# Patient Record
Sex: Male | Born: 1965
Health system: Southern US, Community
[De-identification: ages and names within clinical notes are randomized; demographics above are authoritative.]

## PROBLEM LIST (undated history)

## (undated) DIAGNOSIS — K219 Gastro-esophageal reflux disease without esophagitis: Secondary | ICD-10-CM

## (undated) DIAGNOSIS — E785 Hyperlipidemia, unspecified: Secondary | ICD-10-CM

## (undated) DIAGNOSIS — E119 Type 2 diabetes mellitus without complications: Secondary | ICD-10-CM

## (undated) DIAGNOSIS — I10 Essential (primary) hypertension: Secondary | ICD-10-CM

## (undated) DIAGNOSIS — I2510 Atherosclerotic heart disease of native coronary artery without angina pectoris: Secondary | ICD-10-CM

## (undated) DIAGNOSIS — I739 Peripheral vascular disease, unspecified: Secondary | ICD-10-CM

## (undated) HISTORY — DX: Type 2 diabetes mellitus without complications: E11.9

## (undated) HISTORY — DX: Essential (primary) hypertension: I10

## (undated) HISTORY — DX: Hyperlipidemia, unspecified: E78.5

---

## 2002-07-21 DIAGNOSIS — E119 Type 2 diabetes mellitus without complications: Secondary | ICD-10-CM

## 2002-07-21 HISTORY — DX: Type 2 diabetes mellitus without complications: E11.9

## 2005-09-28 ENCOUNTER — Emergency Department (HOSPITAL_COMMUNITY): Admission: EM | Admit: 2005-09-28 | Discharge: 2005-09-28 | Payer: Self-pay | Admitting: Emergency Medicine

## 2007-09-23 ENCOUNTER — Encounter: Payer: Self-pay | Admitting: Endocrinology

## 2007-09-28 ENCOUNTER — Encounter: Payer: Self-pay | Admitting: Endocrinology

## 2007-10-11 ENCOUNTER — Ambulatory Visit: Payer: Self-pay | Admitting: Endocrinology

## 2007-10-11 DIAGNOSIS — R209 Unspecified disturbances of skin sensation: Secondary | ICD-10-CM | POA: Insufficient documentation

## 2007-10-11 DIAGNOSIS — I10 Essential (primary) hypertension: Secondary | ICD-10-CM | POA: Insufficient documentation

## 2007-10-11 DIAGNOSIS — E7800 Pure hypercholesterolemia, unspecified: Secondary | ICD-10-CM | POA: Insufficient documentation

## 2007-10-11 HISTORY — DX: Essential (primary) hypertension: I10

## 2007-10-21 ENCOUNTER — Ambulatory Visit: Payer: Self-pay | Admitting: Endocrinology

## 2007-11-12 ENCOUNTER — Ambulatory Visit: Payer: Self-pay | Admitting: Endocrinology

## 2007-11-12 DIAGNOSIS — E109 Type 1 diabetes mellitus without complications: Secondary | ICD-10-CM | POA: Insufficient documentation

## 2007-12-29 ENCOUNTER — Ambulatory Visit: Payer: Self-pay | Admitting: Internal Medicine

## 2008-01-07 ENCOUNTER — Telehealth: Payer: Self-pay | Admitting: Endocrinology

## 2008-04-06 ENCOUNTER — Ambulatory Visit: Payer: Self-pay | Admitting: Endocrinology

## 2008-06-08 ENCOUNTER — Encounter (INDEPENDENT_AMBULATORY_CARE_PROVIDER_SITE_OTHER): Payer: Self-pay | Admitting: *Deleted

## 2008-06-16 ENCOUNTER — Telehealth (INDEPENDENT_AMBULATORY_CARE_PROVIDER_SITE_OTHER): Payer: Self-pay | Admitting: *Deleted

## 2008-09-29 ENCOUNTER — Ambulatory Visit: Payer: Self-pay | Admitting: Endocrinology

## 2008-11-20 ENCOUNTER — Telehealth: Payer: Self-pay | Admitting: Internal Medicine

## 2008-11-21 ENCOUNTER — Telehealth (INDEPENDENT_AMBULATORY_CARE_PROVIDER_SITE_OTHER): Payer: Self-pay | Admitting: *Deleted

## 2008-11-23 ENCOUNTER — Ambulatory Visit: Payer: Self-pay | Admitting: Endocrinology

## 2009-02-05 ENCOUNTER — Telehealth (INDEPENDENT_AMBULATORY_CARE_PROVIDER_SITE_OTHER): Payer: Self-pay | Admitting: *Deleted

## 2009-03-07 ENCOUNTER — Telehealth: Payer: Self-pay | Admitting: Endocrinology

## 2009-04-05 ENCOUNTER — Ambulatory Visit: Payer: Self-pay | Admitting: Endocrinology

## 2009-05-16 ENCOUNTER — Telehealth (INDEPENDENT_AMBULATORY_CARE_PROVIDER_SITE_OTHER): Payer: Self-pay | Admitting: *Deleted

## 2009-05-22 ENCOUNTER — Ambulatory Visit: Payer: Self-pay | Admitting: Endocrinology

## 2009-05-22 LAB — CONVERTED CEMR LAB
Hgb A1c MFr Bld: 8.1 % — ABNORMAL HIGH (ref 4.6–6.5)
Microalb Creat Ratio: 5.4 mg/g (ref 0.0–30.0)

## 2009-08-07 ENCOUNTER — Encounter: Payer: Self-pay | Admitting: Endocrinology

## 2009-08-13 ENCOUNTER — Ambulatory Visit: Payer: Self-pay | Admitting: Endocrinology

## 2009-08-13 DIAGNOSIS — R05 Cough: Secondary | ICD-10-CM

## 2009-08-13 DIAGNOSIS — R059 Cough, unspecified: Secondary | ICD-10-CM | POA: Insufficient documentation

## 2009-08-13 LAB — CONVERTED CEMR LAB: Hgb A1c MFr Bld: 8 % — ABNORMAL HIGH (ref 4.6–6.5)

## 2010-08-05 ENCOUNTER — Telehealth: Payer: Self-pay | Admitting: Endocrinology

## 2010-08-21 NOTE — Letter (Signed)
Summary: [facility]  [facility]   Imported By: Lester Cloverly 08/20/2009 10:57:21  _____________________________________________________________________  External Attachment:    Type:   Image     Comment:   External Document

## 2010-08-21 NOTE — Assessment & Plan Note (Signed)
Summary: 3 MTH FU  #  STC  PRESSED "1" TO CX/ LMOM TO CONFIRM/NWS   Vital Signs:  Patient profile:   45 year old male Height:      68 inches (172.72 cm) Weight:      163.25 pounds (74.20 kg) O2 Sat:      97 % on Room air Temp:     97.4 degrees F (36.33 degrees C) oral Pulse rate:   86 / minute BP sitting:   130 / 80  (left arm) Cuff size:   large  Vitals Entered By: Josph Macho CMA (August 13, 2009 7:56 AM)  O2 Flow:  Room air CC: 3 month follow up/ CF Is Patient Diabetic? Yes   Referring Provider:  swayne  CC:  3 month follow up/ CF.  History of Present Illness: pt states slight intermittent chronic dry-quality cough.  no associated pain in the chest. no cbg record, but states cbg's are well-controlled, except up to mid-100's in the afternoon.     Current Medications (verified): 1)  Lisinopril 10 Mg  Tabs (Lisinopril) .... Take 1 Tablet By Mouth Once A Day 2)  Lantus Solostar 100 Unit/ml  Soln (Insulin Glargine) .... 3 Units Qhs 3)  Crestor 10 Mg Tabs (Rosuvastatin Calcium) .... Take 1 By Mouth Qd 4)  Novolog Flexpen 100 Unit/ml Soln (Insulin Aspart) .... 3 Units Three Times A Day (Qac) 5)  Bd U/f Short Pen Needle 31g X 8 Mm Misc (Insulin Pen Needle) .... Qid 6)  Relion Pen Needle 31g X 8 Mm Misc (Insulin Pen Needle) .... Use Qid 7)  Humalog Kwikpen 100 Unit/ml Soln (Insulin Lispro (Human)) .... 3 Units Three Times A Day (Qac)  Allergies (verified): No Known Drug Allergies  Past History:  Past Medical History: Last updated: 04/06/2008 HYPERCHOLESTEROLEMIA (ICD-272.0) HYPERTENSION (ICD-401.9) NUMBNESS (ICD-782.0) DIABETES MELLITUS, TYPE I, UNCONTROLLED (ICD-250.03)  Review of Systems  The patient denies dyspnea on exertion.         denies hypoglycemia  Physical Exam  General:  normal appearance.   Lungs:  Clear to auscultation bilaterally. Normal respiratory effort.  Additional Exam:  Hemoglobin A1C       [H]  8.0 %      Impression &  Recommendations:  Problem # 1:  DIABETES MELLITUS, TYPE I (ICD-250.01) needs increased rx  Problem # 2:  COUGH DUE TO ACE INHIBITORS (ICD-786.2) Assessment: New  Medications Added to Medication List This Visit: 1)  Humalog Kwikpen 100 Unit/ml Soln (Insulin lispro (human)) .... Three times a day (just before each meal) 3-5-3 units 2)  Losartan Potassium 50 Mg Tabs (Losartan potassium) .Marland Kitchen.. 1 once daily  Other Orders: TLB-A1C / Hgb A1C (Glycohemoglobin) (83036-A1C) Est. Patient Level IV ([address])  Patient Instructions: 1)  continue humalog (or novolog) 3 units three times a day (just before each meal) 2)  continue lantus 5 units at night 3)  check your blood sugar 2 times a day.  vary the time of day when you check, between before the 3 meals, and at bedtime.  also check if you have symptoms of your blood sugar being too high or too low.  please keep a record of the readings and bring it to your next appointment here.  please call us sooner if you are having low blood sugar episodes. 4)  tests are being ordered for you today.  a few days after the test(s), please call [phone] to hear your test results.   5)  Please schedule a follow-up appointment  in 3 months. 6)  change lisinopril to losartan 50 mg once daily. 7)  (update: i left message on phone-tree:  increase lunch humalog to 5 units) Prescriptions: LOSARTAN POTASSIUM 50 MG TABS (LOSARTAN POTASSIUM) 1 once daily  #30 x 11   Entered and Authorized by:   Minus Breeding MD   Signed by:   Minus Breeding MD on 08/13/2009   Method used:   Electronically to        [facility] (retail)       [address]       [place], [place]  [address]       Ph: [phone]       Fax: [phone]   RxID:   [id number]

## 2010-08-22 NOTE — Progress Notes (Signed)
Summary: lantus & humalog  Phone Note Refill Request Message from:  Fax from Pharmacy on August 05, 2010 1:46 PM  Refills Requested: Medication #1:  LANTUS SOLOSTAR 100 UNIT/ML  SOLN 3 units qhs  Medication #2:  HUMALOG KWIKPEN 100 UNIT/ML SOLN three times a day (just before each meal) 3-5-3 units [facility] [address] Needing new rx sent pt has change pharacy never filled here. Pls send new rx   Method Requested: Electronic Initial call taken by: Orlan Leavens RMA,  August 05, 2010 1:47 PM Caller: [facility] [address]    Prescriptions: HUMALOG KWIKPEN 100 UNIT/ML SOLN (INSULIN LISPRO (HUMAN)) three times a day (just before each meal) 3-5-3 units  #15 [place] x 0   Entered by:   Orlan Leavens RMA   Authorized by:   Minus Breeding MD   Signed by:   Orlan Leavens RMA on 08/05/2010   Method used:   Electronically to        [id number] (retail)       [address]       [place], [place]  [address]       Ph: [phone]       Fax: [phone]   RxID:   [id number] LANTUS SOLOSTAR 100 UNIT/ML  SOLN (INSULIN GLARGINE) 3 units qhs  #15 [place] x 0   Entered by:   Orlan Leavens RMA   Authorized by:   Minus Breeding MD   Signed by:   Orlan Leavens RMA on 08/05/2010   Method used:   Electronically to        [id number] (retail)       [address]       [place], [place]  [address]       Ph: [phone]       Fax: [phone]   RxID:   [id number]

## 2012-06-06 ENCOUNTER — Ambulatory Visit (INDEPENDENT_AMBULATORY_CARE_PROVIDER_SITE_OTHER): Payer: 59 | Admitting: Internal Medicine

## 2012-06-06 VITALS — BP 130/83 | HR 89 | Temp 98.5°F | Resp 16 | Ht 67.75 in | Wt 192.6 lb

## 2012-06-06 DIAGNOSIS — J019 Acute sinusitis, unspecified: Secondary | ICD-10-CM

## 2012-06-06 MED ORDER — AMOXICILLIN 500 MG PO CAPS: 1000.0000 mg | ORAL_CAPSULE | Freq: Two times a day (BID) | ORAL | Status: AC

## 2012-06-06 NOTE — Progress Notes (Signed)
  Subjective:    Patient ID: Peter Blake, male    DOB: [birth year], 46 y.o.   MRN: [id number]  HPI C/o 3 weeks of congestion getting worse with cough in the night No fever No ST Cough nonprod Now w/ face pain-pressure  . Patient Active Problem List  Diagnosis  . DIABETES MELLITUS, TYPE I  . HYPERCHOLESTEROLEMIA  . HYPERTENSION  . NUMBNESS  . COUGH DUE TO ACE INHIBITORS   Prior to Admission medications   Medication Sig Start Date End Date Taking? Authorizing Provider  amLODipine (NORVASC) 5 MG tablet Take 5 mg by mouth daily.   Yes Historical Provider, MD  atorvastatin (LIPITOR) 10 MG tablet Take 10 mg by mouth daily.   Yes Historical Provider, MD  esomeprazole (NEXIUM) 20 MG capsule Take 20 mg by mouth daily before breakfast.   Yes Historical Provider, MD  insulin glargine (LANTUS) 100 UNIT/ML injection Inject 35 Units into the skin at bedtime.   Yes Historical Provider, MD  insulin lispro (HUMALOG) 100 UNIT/ML injection Inject into the skin 3 (three) times daily before meals. 20 units with each meal   Yes Historical Provider, MD  losartan (COZAAR) 25 MG tablet Take 25 mg by mouth daily.   Yes Historical Provider, MD           Review of Systems     Objective:   Physical Exam VS=stable Tms clear Nares purulent Tender max sin bilat No nodes Chest clear       Assessment & Plan:  Sinusitis Meds ordered this encounter  Medications  . amoxicillin (AMOXIL) 500 MG capsule    Sig: Take 2 capsules (1,000 mg total) by mouth 2 (two) times daily.    Dispense:  40 capsule    Refill:  0   otc decong F/u 1 week if not well

## 2012-08-25 ENCOUNTER — Emergency Department (HOSPITAL_BASED_OUTPATIENT_CLINIC_OR_DEPARTMENT_OTHER): Payer: 59

## 2012-08-25 ENCOUNTER — Encounter (HOSPITAL_BASED_OUTPATIENT_CLINIC_OR_DEPARTMENT_OTHER): Payer: Self-pay | Admitting: Emergency Medicine

## 2012-08-25 ENCOUNTER — Emergency Department (HOSPITAL_BASED_OUTPATIENT_CLINIC_OR_DEPARTMENT_OTHER)
Admission: EM | Admit: 2012-08-25 | Discharge: 2012-08-26 | Disposition: A | Discharge status: Home / Self Care | Payer: 59 | Attending: Emergency Medicine | Admitting: Emergency Medicine

## 2012-08-25 DIAGNOSIS — R109 Unspecified abdominal pain: Secondary | ICD-10-CM | POA: Insufficient documentation

## 2012-08-25 DIAGNOSIS — Z794 Long term (current) use of insulin: Secondary | ICD-10-CM | POA: Insufficient documentation

## 2012-08-25 DIAGNOSIS — R141 Gas pain: Secondary | ICD-10-CM | POA: Insufficient documentation

## 2012-08-25 DIAGNOSIS — K8020 Calculus of gallbladder without cholecystitis without obstruction: Secondary | ICD-10-CM | POA: Insufficient documentation

## 2012-08-25 DIAGNOSIS — E1149 Type 2 diabetes mellitus with other diabetic neurological complication: Secondary | ICD-10-CM | POA: Insufficient documentation

## 2012-08-25 DIAGNOSIS — K3184 Gastroparesis: Secondary | ICD-10-CM | POA: Insufficient documentation

## 2012-08-25 DIAGNOSIS — I10 Essential (primary) hypertension: Secondary | ICD-10-CM | POA: Insufficient documentation

## 2012-08-25 DIAGNOSIS — K219 Gastro-esophageal reflux disease without esophagitis: Secondary | ICD-10-CM | POA: Insufficient documentation

## 2012-08-25 DIAGNOSIS — Z79899 Other long term (current) drug therapy: Secondary | ICD-10-CM | POA: Insufficient documentation

## 2012-08-25 DIAGNOSIS — E785 Hyperlipidemia, unspecified: Secondary | ICD-10-CM | POA: Insufficient documentation

## 2012-08-25 DIAGNOSIS — R142 Eructation: Secondary | ICD-10-CM | POA: Insufficient documentation

## 2012-08-25 HISTORY — DX: Gastro-esophageal reflux disease without esophagitis: K21.9

## 2012-08-25 LAB — COMPREHENSIVE METABOLIC PANEL
BUN: 16 mg/dL (ref 6–23)
CO2: 27 mEq/L (ref 19–32)
Calcium: 9.3 mg/dL (ref 8.4–10.5)
Creatinine, Ser: 1 mg/dL (ref 0.50–1.35)
GFR calc Af Amer: >90 mL/min (ref >90)
GFR calc non Af Amer: 88 mL/min — ABNORMAL LOW (ref >90)
Glucose, Bld: 121 mg/dL — ABNORMAL HIGH (ref 70–99)
Sodium: 141 mEq/L (ref 135–145)
Total Protein: 7.6 g/dL (ref 6.0–8.3)

## 2012-08-25 LAB — CBC WITH DIFFERENTIAL/PLATELET
Basophils Relative: 0 % (ref 0–1)
HCT: 43.2 % (ref 39.0–52.0)
Hemoglobin: 15.2 g/dL (ref 13.0–17.0)
Lymphocytes Relative: 25 % (ref 12–46)
Lymphs Abs: 2.7 10*3/uL (ref 0.7–4.0)
MCHC: 35.2 g/dL (ref 30.0–36.0)
Monocytes Absolute: 1 10*3/uL (ref 0.1–1.0)
Monocytes Relative: 9 % (ref 3–12)
Neutro Abs: 6.4 10*3/uL (ref 1.7–7.7)
Neutrophils Relative %: 59 % (ref 43–77)
RBC: 5.08 MIL/uL (ref 4.22–5.81)
WBC: 10.8 10*3/uL — ABNORMAL HIGH (ref 4.0–10.5)

## 2012-08-25 LAB — TROPONIN I: Troponin I: <0.3 ng/mL (ref <0.30)

## 2012-08-25 MED ORDER — IOHEXOL 300 MG/ML  SOLN
50.0000 mL | Freq: Once | INTRAMUSCULAR | Status: AC | PRN
  Administered 2012-08-25: 50 mL via ORAL

## 2012-08-25 MED ORDER — SODIUM CHLORIDE 0.9 % IV SOLN
INTRAVENOUS | Status: DC
  Administered 2012-08-25: 22:00:00 via INTRAVENOUS

## 2012-08-25 MED ORDER — IOHEXOL 300 MG/ML  SOLN
100.0000 mL | Freq: Once | INTRAMUSCULAR | Status: AC | PRN
  Administered 2012-08-25: 100 mL via INTRAVENOUS

## 2012-08-25 NOTE — ED Provider Notes (Addendum)
History     CSN: [id number]  Arrival date & time 08/25/12  2140   First MD Initiated Contact with Patient 08/25/12 2145      Chief Complaint  Patient presents with  . Abdominal Pain    (Consider location/radiation/quality/duration/timing/severity/associated sxs/prior treatment) Patient is a 47 y.o. male presenting with abdominal pain. The history is provided by the patient.  Abdominal Pain The primary symptoms of the illness include abdominal pain.   patient here with diffuse abdominal pain which began after he had a large meal prior to arrival. Pain started in his lower abd area and radiates up into his epigastric area. He had emesis x1 which made him feel better. Patient denies any anginal type chest pain. No recent fever or chills. Denies any history of gallbladder disease. Does have a history of esophageal reflux. Notes diffuse abdominal distention at this time. No diarrhea. Called EMS and was given Zofran does flow better at this time  Past Medical History  Diagnosis Date  . Diabetes mellitus without complication   . Hyperlipidemia   . Hypertension   . GERD (gastroesophageal reflux disease)     History reviewed. No pertinent past surgical history.  No family history on file.  History  Substance Use Topics  . Smoking status: Never Smoker   . Smokeless tobacco: Not on file  . Alcohol Use: No      Review of Systems  Gastrointestinal: Positive for abdominal pain.  All other systems reviewed and are negative.    Allergies  Review of patient's allergies indicates no known allergies.  Home Medications   Current Outpatient Rx  Name  Route  Sig  Dispense  Refill  . UNABLE TO FIND   Oral   Take 300 mg by mouth daily before breakfast. Invokana         . AMLODIPINE BESYLATE 5 MG PO TABS   Oral   Take 5 mg by mouth daily.         . ATORVASTATIN CALCIUM 10 MG PO TABS   Oral   Take 40 mg by mouth daily.          Marland Kitchen ESOMEPRAZOLE MAGNESIUM 20 MG PO CPDR    Oral   Take 40 mg by mouth daily before breakfast.          . INSULIN GLARGINE 100 UNIT/ML [place] SOLN   Subcutaneous   Inject 35 Units into the skin at bedtime.         . INSULIN LISPRO (HUMAN) 100 UNIT/ML [place] SOLN   Subcutaneous   Inject into the skin 3 (three) times daily before meals. 20 units with each meal         . LOSARTAN POTASSIUM 25 MG PO TABS   Oral   Take 100 mg by mouth daily.            BP 148/89  Pulse 86  Temp 97.7 F (36.5 C) (Oral)  Resp 18  Ht 5\' 8"  (1.727 m)  Wt 190 lb (86.183 kg)  BMI 28.89 kg/m2  SpO2 95%  Physical Exam  Nursing note and vitals reviewed. Constitutional: He is oriented to person, place, and time. He appears well-developed and well-nourished.  Non-toxic appearance. No distress.  HENT:  Head: Normocephalic and atraumatic.  Eyes: Conjunctivae normal, EOM and lids are normal. Pupils are equal, round, and reactive to light.  Neck: Normal range of motion. Neck supple. No tracheal deviation present. No mass present.  Cardiovascular: Normal rate, regular rhythm and normal heart  sounds.  Exam reveals no gallop.   No murmur heard. Pulmonary/Chest: Effort normal and breath sounds normal. No stridor. No respiratory distress. He has no decreased breath sounds. He has no wheezes. He has no rhonchi. He has no rales.  Abdominal: Soft. Normal appearance and bowel sounds are normal. He exhibits distension. There is no tenderness. There is no rigidity, no rebound, no guarding and no CVA tenderness.  Musculoskeletal: Normal range of motion. He exhibits no edema and no tenderness.  Neurological: He is alert and oriented to person, place, and time. He has normal strength. No cranial nerve deficit or sensory deficit. GCS eye subscore is 4. GCS verbal subscore is 5. GCS motor subscore is 6.  Skin: Skin is warm and dry. No abrasion and no rash noted.  Psychiatric: He has a normal mood and affect. His speech is normal and behavior is normal.    ED Course   Procedures (including critical care time)  Labs Reviewed - No data to display No results found.   No diagnosis found.    MDM  Pt given iv fluids and continues to note abd distention, will order abd ct to r/o sbo-- Pt informed to f/u his pcp about his elevated lfts likely from his new med he just started 10:56 PM abd re-examined and is without tenderness Pt signed out to dr. Judd Lien   Date: 08/25/2012  Rate: 81  Rhythm: normal sinus rhythm  QRS Axis: normal  Intervals: normal  ST/T Wave abnormalities: nonspecific T wave changes  Conduction Disutrbances:none  Narrative Interpretation:   Old EKG Reviewed: none available        Toy Baker, MD 08/25/12 2257  Toy Baker, MD 08/25/12 2258

## 2012-08-25 NOTE — ED Notes (Signed)
MD at bedside.  Discussed plan of care options with pt. Will do CT abd with contrast.

## 2012-08-25 NOTE — ED Notes (Signed)
Abdominal pain started at approx 2015 after eating large starchy meal at home.  Epigastric pain radiating to mid back.  Vomited x1, decreasing pain from 10/10 to 5/10. Given Zofran 4mg  IV.

## 2012-08-25 NOTE — ED Notes (Signed)
MD at bedside. 

## 2012-08-25 NOTE — ED Notes (Signed)
Patient transported to X-ray 

## 2012-08-26 MED ORDER — METOCLOPRAMIDE HCL 5 MG/ML IJ SOLN
INTRAMUSCULAR | Status: AC
  Administered 2012-08-26: 10 mg via INTRAVENOUS
  Filled 2012-08-26: qty 2

## 2012-08-26 MED ORDER — METOCLOPRAMIDE HCL 5 MG/ML IJ SOLN
10.0000 mg | Freq: Once | INTRAMUSCULAR | Status: AC
  Administered 2012-08-26: 10 mg via INTRAVENOUS

## 2012-08-26 MED ORDER — HYDROCODONE-ACETAMINOPHEN 5-325 MG PO TABS: 2.0000 | ORAL_TABLET | Freq: Four times a day (QID) | ORAL | Status: DC | PRN

## 2012-08-26 MED ORDER — METOCLOPRAMIDE HCL 10 MG PO TABS: 10.0000 mg | ORAL_TABLET | Freq: Three times a day (TID) | ORAL | Status: DC

## 2012-08-26 NOTE — ED Provider Notes (Signed)
Care assumed from Dr. Freida Busman at shift change awaiting ct scan results.  The patient presents with abd discomfort, distension after eating a large meal this evening.  His labs showed mild elevations of lfts but were otherwise unremarkable.  A ct scan was ordered and revealed marked distension of the stomach consistent with gastroparesis.  He was given reglan.  He also had multiple gallstones but evidence ductal dilatation or obstruction.  Radiology felt as though his symptoms were related to gastroparesis as the patient is diabetic.  The re-examination of the abdomen was benign except for mild distension.  There was no rebound or guarding.  He was feeling better with the meds given by Dr. Freida Busman and seems appropriate for discharge.  He was given a prescription for reglan and the contact information for both Gastroenterology and [facility] were given as I believe he needs to see both.  He appeared well and ambulated from the ED without any difficulty.    Peter Lyons, MD 08/26/12 [phone]

## 2012-11-11 ENCOUNTER — Ambulatory Visit (INDEPENDENT_AMBULATORY_CARE_PROVIDER_SITE_OTHER): Payer: 59 | Admitting: Family Medicine

## 2012-11-11 ENCOUNTER — Encounter: Payer: Self-pay | Admitting: Family Medicine

## 2012-11-11 VITALS — BP 130/86 | HR 88 | Temp 98.1°F | Resp 16 | Ht 68.0 in | Wt 188.0 lb

## 2012-11-11 DIAGNOSIS — E162 Hypoglycemia, unspecified: Secondary | ICD-10-CM

## 2012-11-11 DIAGNOSIS — E119 Type 2 diabetes mellitus without complications: Secondary | ICD-10-CM

## 2012-11-11 LAB — GLUCOSE, POCT (MANUAL RESULT ENTRY)
POC Glucose: 44 mg/dl — AB (ref 70–99)
POC Glucose: 96 mg/dl (ref 70–99)

## 2012-11-11 NOTE — Progress Notes (Signed)
Subjective:    Patient ID: Peter Blake, male    DOB: Dec 03, 1965, 47 y.o.   MRN: [id number]  HPI Peter Blake is a 47 y.o. male Hx of Dm - insulin dependent. Initially in office for premployment physical.  Glycosuria noted, so fingerstick blood sugar obtained - glucose 44 at 1900 - 19 grams of glucose given by apple juice.  Did admit to feeling somewhat shaky in office waiting room and presently.   Not eating normally today - fasting this am, less to eat, but did use less insulin.  Less to eat this afternoon.  Ate chips and koolaid with 18units of insulin at 1600.  No recent illness.   Review of Systems  Constitutional: Negative for fever and chills.  Neurological: Negative for dizziness, tremors, seizures, syncope and speech difficulty.       Shaky.    As above.     Objective:   Physical Exam  Vitals reviewed. Constitutional: He is oriented to person, place, and time. He appears well-developed and well-nourished.  HENT:  Head: Normocephalic and atraumatic.  Eyes: EOM are normal. Pupils are equal, round, and reactive to light.  Neck: No JVD present. Carotid bruit is not present.  Cardiovascular: Normal rate, regular rhythm and normal heart sounds.   No murmur heard. Pulmonary/Chest: Effort normal and breath sounds normal. He has no rales.  Musculoskeletal: He exhibits no edema.  Neurological: He is alert and oriented to person, place, and time.  Skin: Skin is warm and dry.  Psychiatric: He has a normal mood and affect.    Recheck at 1912: feels better - denies dizziness. Fingerstick glucose 54.  Repeat 19 grams sugar by apple juice - repeat glucose 86 at 1945 and feels well.   Peanut butter crackers given at 2000 - glucose 96 in 10 minutes. Feels well.   Results for orders placed in visit on 11/11/12  GLUCOSE, POCT (MANUAL RESULT ENTRY)      Result Value Range   POC Glucose 44 (*) 70 - 99 mg/dl  GLUCOSE, POCT (MANUAL RESULT ENTRY)      Result Value Range   POC Glucose  54 (*) 70 - 99 mg/dl  GLUCOSE, POCT (MANUAL RESULT ENTRY)      Result Value Range   POC Glucose 86  70 - 99 mg/dl  GLUCOSE, POCT (MANUAL RESULT ENTRY)      Result Value Range   POC Glucose 96  70 - 99 mg/dl       Assessment & Plan:  Peter Blake is a 47 y.o. male Diabetes - Plan: POCT glucose (manual entry), POCT glucose (manual entry), POCT glucose (manual entry), POCT glucose (manual entry)  Hypoglycemia  Diabetes with hypoglycemia noted in office. Minimally symptomatic with shaky feeling.  Improved with 2 containers of apple juice and crackers. felt much better after 1st juice and asymptomatic remainder of time in office. Planned on dinner after leaving office.  Discussed risks of hypoglycemia and need for discussion of regimen with endocrinologist, including concerns of hypoglycemia and how this could affect work. Understanding expressed and plans on following up with endocrinologist. Er/911 precautions discussed. Plan to be seen by endocrine in next week.   Patient Instructions  Go directly home and eat dinner. Return to the clinic or go to the nearest emergency room if any of your symptoms worsen or new symptoms occur. Call your endocrinologist tomorrow to be seen in the next week if possible to discuss your diabetes regimen as you have had low blood sugars.  Hypoglycemia (Low Blood Sugar) Hypoglycemia is when the glucose (sugar) in your blood is too low. Hypoglycemia can happen for many reasons. It can happen to people with or without diabetes. Hypoglycemia can develop quickly and can be a medical emergency.  CAUSES  Having hypoglycemia does not mean that you will develop diabetes. Different causes include:  Missed or delayed meals or not enough carbohydrates eaten.  Medication overdose. This could be by accident or deliberate. If by accident, your medication may need to be adjusted or changed.  Exercise or increased activity without adjustments in carbohydrates or  medications.  A nerve disorder that affects body functions like your heart rate, blood pressure and digestion (autonomic neuropathy).  A condition where the stomach muscles do not function properly (gastroparesis). Therefore, medications may not absorb properly.  The inability to recognize the signs of hypoglycemia (hypoglycemic unawareness).  Absorption of insulin  may be altered.  Alcohol consumption.  Pregnancy/menstrual cycles/postpartum. This may be due to hormones.  Certain kinds of tumors. This is very rare. SYMPTOMS   Sweating.  Hunger.  Dizziness.  Blurred vision.  Drowsiness.  Weakness.  Headache.  Rapid heart beat.  Shakiness.  Nervousness. DIAGNOSIS  Diagnosis is made by monitoring blood glucose in one or all of the following ways:  Fingerstick blood glucose monitoring.  Laboratory results. TREATMENT  If you think your blood glucose is low:  Check your blood glucose, if possible. If it is less than 70 mg/dl, take one of the following:  3-4 glucose tablets.   cup juice (prefer clear like apple).   cup "regular" soda pop.  1 cup milk.  -1 tube of glucose gel.  5-6 hard candies.  Do not over treat because your blood glucose (sugar) will only go too high.  Wait 15 minutes and recheck your blood glucose. If it is still less than 70 mg/dl (or below your target range), repeat treatment.  Eat a snack if it is more than one hour until your next meal. Sometimes, your blood glucose may go so low that you are unable to treat yourself. You may need someone to help you. You may even pass out or be unable to swallow. This may require you to get an injection of glucagon, which raises the blood glucose. HOME CARE INSTRUCTIONS  Check blood glucose as recommended by your caregiver.  Take medication as prescribed by your caregiver.  Follow your meal plan. Do not skip meals. Eat on time.  If you are going to drink alcohol, drink it only with  meals.  Check your blood glucose before driving.  Check your blood glucose before and after exercise. If you exercise longer or different than usual, be sure to check blood glucose more frequently.  Always carry treatment with you. Glucose tablets are the easiest to carry.  Always wear medical alert jewelry or carry some form of identification that states that you have diabetes. This will alert people that you have diabetes. If you have hypoglycemia, they will have a better idea on what to do. SEEK MEDICAL CARE IF:   You are having problems keeping your blood sugar at target range.  You are having frequent episodes of hypoglycemia.  You feel you might be having side effects from your medicines.  You have symptoms of an illness that is not improving after 3-4 days.  You notice a change in vision or a new problem with your vision. SEEK IMMEDIATE MEDICAL CARE IF:   You are a family member or friend of  a person whose blood glucose goes below 70 mg/dl and is accompanied by:  Confusion.  A change in mental status.  The inability to swallow.  Passing out. Document Released: 07/07/2005 Document Revised: 09/29/2011 Document Reviewed: 11/03/2011 [facility] Patient Information 2013 [place], [place].

## 2012-11-11 NOTE — Patient Instructions (Signed)
Go directly home and eat dinner. Return to the clinic or go to the nearest emergency room if any of your symptoms worsen or new symptoms occur. Call your endocrinologist tomorrow to be seen in the next week if possible to discuss your diabetes regimen as you have had low blood sugars.  Hypoglycemia (Low Blood Sugar) Hypoglycemia is when the glucose (sugar) in your blood is too low. Hypoglycemia can happen for many reasons. It can happen to people with or without diabetes. Hypoglycemia can develop quickly and can be a medical emergency.  CAUSES  Having hypoglycemia does not mean that you will develop diabetes. Different causes include:  Missed or delayed meals or not enough carbohydrates eaten.  Medication overdose. This could be by accident or deliberate. If by accident, your medication may need to be adjusted or changed.  Exercise or increased activity without adjustments in carbohydrates or medications.  A nerve disorder that affects body functions like your heart rate, blood pressure and digestion (autonomic neuropathy).  A condition where the stomach muscles do not function properly (gastroparesis). Therefore, medications may not absorb properly.  The inability to recognize the signs of hypoglycemia (hypoglycemic unawareness).  Absorption of insulin  may be altered.  Alcohol consumption.  Pregnancy/menstrual cycles/postpartum. This may be due to hormones.  Certain kinds of tumors. This is very rare. SYMPTOMS   Sweating.  Hunger.  Dizziness.  Blurred vision.  Drowsiness.  Weakness.  Headache.  Rapid heart beat.  Shakiness.  Nervousness. DIAGNOSIS  Diagnosis is made by monitoring blood glucose in one or all of the following ways:  Fingerstick blood glucose monitoring.  Laboratory results. TREATMENT  If you think your blood glucose is low:  Check your blood glucose, if possible. If it is less than 70 mg/dl, take one of the following:  3-4 glucose  tablets.   cup juice (prefer clear like apple).   cup "regular" soda pop.  1 cup milk.  -1 tube of glucose gel.  5-6 hard candies.  Do not over treat because your blood glucose (sugar) will only go too high.  Wait 15 minutes and recheck your blood glucose. If it is still less than 70 mg/dl (or below your target range), repeat treatment.  Eat a snack if it is more than one hour until your next meal. Sometimes, your blood glucose may go so low that you are unable to treat yourself. You may need someone to help you. You may even pass out or be unable to swallow. This may require you to get an injection of glucagon, which raises the blood glucose. HOME CARE INSTRUCTIONS  Check blood glucose as recommended by your caregiver.  Take medication as prescribed by your caregiver.  Follow your meal plan. Do not skip meals. Eat on time.  If you are going to drink alcohol, drink it only with meals.  Check your blood glucose before driving.  Check your blood glucose before and after exercise. If you exercise longer or different than usual, be sure to check blood glucose more frequently.  Always carry treatment with you. Glucose tablets are the easiest to carry.  Always wear medical alert jewelry or carry some form of identification that states that you have diabetes. This will alert people that you have diabetes. If you have hypoglycemia, they will have a better idea on what to do. SEEK MEDICAL CARE IF:   You are having problems keeping your blood sugar at target range.  You are having frequent episodes of hypoglycemia.  You feel  you might be having side effects from your medicines.  You have symptoms of an illness that is not improving after 3-4 days.  You notice a change in vision or a new problem with your vision. SEEK IMMEDIATE MEDICAL CARE IF:   You are a family member or friend of a person whose blood glucose goes below 70 mg/dl and is accompanied by:  Confusion.  A  change in mental status.  The inability to swallow.  Passing out. Document Released: 07/07/2005 Document Revised: 09/29/2011 Document Reviewed: 11/03/2011 [facility] Patient Information 2013 [place], [place].

## 2012-11-18 ENCOUNTER — Telehealth: Payer: Self-pay

## 2012-11-18 DIAGNOSIS — E119 Type 2 diabetes mellitus without complications: Secondary | ICD-10-CM

## 2012-11-18 NOTE — Telephone Encounter (Signed)
Patient would like to see dr Juleen China for an endo appt if possible please call patient at [phone]

## 2012-11-18 NOTE — Telephone Encounter (Signed)
Please advise 

## 2012-11-19 NOTE — Telephone Encounter (Signed)
Would be happy to refer him there, but is he transferring from another endocrinologist?  I was under the impression at his last ov he was under the care of an endocrinologist.

## 2012-11-22 NOTE — Telephone Encounter (Signed)
Patient states he was previously seeing Dr Lucianne Muss. He will get records sent to Dr Juleen China. Sent the referral. To you FYI

## 2012-11-26 ENCOUNTER — Telehealth: Payer: Self-pay

## 2012-11-26 NOTE — Telephone Encounter (Signed)
Patient is trying to get a clear answer or have someone to call him back, in regards to his previous message on the 1st of May. He says he is diabetic and has 2 weeks left of his insulin. Wants to make sure that he has established a new dr. Here at our office. He has spoken to Dr.Kohut's office and has gotten no where.  Please call back to advise on the status. Thank you!  [phone]

## 2012-11-26 NOTE — Telephone Encounter (Signed)
Patient is trying to get a clear answer or have someone to call him back, in regards to his previous message on the 1st of May. He says he is diabetic and has 2 weeks left of his insulin. Wants to make sure that he has established a new dr. Here at our office. He has spoken to Dr.Kohut's office and has gotten no where.  °Please call back to advise on the status. Thank you!  °[phone] ° °

## 2012-11-26 NOTE — Telephone Encounter (Signed)
Referral can be sent but he has to provide the records for them to review. He can come here any time he would like called him, his phone cut out.

## 2012-11-29 NOTE — Telephone Encounter (Signed)
Patient was advised his records must be obtained, he must do this, it is his responsibility.

## 2013-12-15 ENCOUNTER — Other Ambulatory Visit: Payer: Self-pay | Admitting: Endocrinology

## 2014-03-15 ENCOUNTER — Observation Stay (HOSPITAL_COMMUNITY)
Admission: EM | Admit: 2014-03-15 | Discharge: 2014-03-17 | Disposition: A | Discharge status: Home / Self Care | Payer: 59 | Attending: General Surgery | Admitting: General Surgery

## 2014-03-15 DIAGNOSIS — K449 Diaphragmatic hernia without obstruction or gangrene: Secondary | ICD-10-CM | POA: Diagnosis not present

## 2014-03-15 DIAGNOSIS — K8020 Calculus of gallbladder without cholecystitis without obstruction: Secondary | ICD-10-CM | POA: Diagnosis present

## 2014-03-15 DIAGNOSIS — I10 Essential (primary) hypertension: Secondary | ICD-10-CM | POA: Insufficient documentation

## 2014-03-15 DIAGNOSIS — Z794 Long term (current) use of insulin: Secondary | ICD-10-CM | POA: Diagnosis not present

## 2014-03-15 DIAGNOSIS — Z79899 Other long term (current) drug therapy: Secondary | ICD-10-CM | POA: Insufficient documentation

## 2014-03-15 DIAGNOSIS — R748 Abnormal levels of other serum enzymes: Secondary | ICD-10-CM

## 2014-03-15 DIAGNOSIS — J8410 Pulmonary fibrosis, unspecified: Secondary | ICD-10-CM | POA: Insufficient documentation

## 2014-03-15 DIAGNOSIS — K219 Gastro-esophageal reflux disease without esophagitis: Secondary | ICD-10-CM | POA: Insufficient documentation

## 2014-03-15 DIAGNOSIS — J811 Chronic pulmonary edema: Secondary | ICD-10-CM | POA: Diagnosis not present

## 2014-03-15 DIAGNOSIS — K8010 Calculus of gallbladder with chronic cholecystitis without obstruction: Secondary | ICD-10-CM | POA: Diagnosis not present

## 2014-03-15 DIAGNOSIS — E109 Type 1 diabetes mellitus without complications: Secondary | ICD-10-CM | POA: Diagnosis not present

## 2014-03-15 DIAGNOSIS — E785 Hyperlipidemia, unspecified: Secondary | ICD-10-CM | POA: Diagnosis not present

## 2014-03-15 DIAGNOSIS — K8000 Calculus of gallbladder with acute cholecystitis without obstruction: Secondary | ICD-10-CM | POA: Diagnosis present

## 2014-03-15 MED ORDER — SODIUM CHLORIDE 0.9 % IV BOLUS (SEPSIS)
1000.0000 mL | Freq: Once | INTRAVENOUS | Status: AC
  Administered 2014-03-16: 1000 mL via INTRAVENOUS

## 2014-03-15 MED ORDER — MORPHINE SULFATE 4 MG/ML IJ SOLN
4.0000 mg | Freq: Once | INTRAMUSCULAR | Status: AC
  Administered 2014-03-16: 4 mg via INTRAVENOUS
  Filled 2014-03-15: qty 1

## 2014-03-15 MED ORDER — ONDANSETRON HCL 4 MG/2ML IJ SOLN
4.0000 mg | Freq: Once | INTRAMUSCULAR | Status: AC
  Administered 2014-03-16: 4 mg via INTRAVENOUS
  Filled 2014-03-15: qty 2

## 2014-03-16 ENCOUNTER — Emergency Department (HOSPITAL_COMMUNITY): Payer: 59

## 2014-03-16 ENCOUNTER — Encounter (HOSPITAL_COMMUNITY): Payer: 59 | Admitting: Anesthesiology

## 2014-03-16 ENCOUNTER — Observation Stay (HOSPITAL_COMMUNITY): Payer: 59 | Admitting: Anesthesiology

## 2014-03-16 ENCOUNTER — Encounter (HOSPITAL_COMMUNITY): Payer: Self-pay

## 2014-03-16 ENCOUNTER — Observation Stay (HOSPITAL_COMMUNITY): Payer: 59

## 2014-03-16 ENCOUNTER — Encounter (HOSPITAL_COMMUNITY)
Admission: EM | Disposition: A | Discharge status: Home / Self Care | Payer: Self-pay | Source: Home / Self Care | Attending: Emergency Medicine

## 2014-03-16 DIAGNOSIS — K8020 Calculus of gallbladder without cholecystitis without obstruction: Secondary | ICD-10-CM

## 2014-03-16 DIAGNOSIS — K8000 Calculus of gallbladder with acute cholecystitis without obstruction: Secondary | ICD-10-CM | POA: Diagnosis present

## 2014-03-16 DIAGNOSIS — K8010 Calculus of gallbladder with chronic cholecystitis without obstruction: Secondary | ICD-10-CM

## 2014-03-16 HISTORY — PX: CHOLECYSTECTOMY: SHX55

## 2014-03-16 LAB — I-STAT CHEM 8, ED
BUN: 16 mg/dL (ref 6–23)
CHLORIDE: 98 meq/L (ref 96–112)
Calcium, Ion: 1.4 mmol/L — ABNORMAL HIGH (ref 1.12–1.23)
Creatinine, Ser: 1.1 mg/dL (ref 0.50–1.35)
GLUCOSE: 205 mg/dL — AB (ref 70–99)
HEMATOCRIT: 50 % (ref 39.0–52.0)
Hemoglobin: 17 g/dL (ref 13.0–17.0)
POTASSIUM: 3.7 meq/L (ref 3.7–5.3)
Sodium: 138 mEq/L (ref 137–147)
TCO2: 29 mmol/L (ref 0–100)

## 2014-03-16 LAB — I-STAT TROPONIN, ED: TROPONIN I, POC: 0 ng/mL (ref 0.00–0.08)

## 2014-03-16 LAB — CBC WITH DIFFERENTIAL/PLATELET
Basophils Absolute: 0 10*3/uL (ref 0.0–0.1)
Basophils Relative: 0 % (ref 0–1)
Eosinophils Absolute: 0.3 10*3/uL (ref 0.0–0.7)
Eosinophils Relative: 3 % (ref 0–5)
HEMATOCRIT: 45 % (ref 39.0–52.0)
HEMOGLOBIN: 16 g/dL (ref 13.0–17.0)
LYMPHS ABS: 3.1 10*3/uL (ref 0.7–4.0)
LYMPHS PCT: 30 % (ref 12–46)
MCH: 29.7 pg (ref 26.0–34.0)
MCHC: 35.6 g/dL (ref 30.0–36.0)
MCV: 83.6 fL (ref 78.0–100.0)
MONO ABS: 0.9 10*3/uL (ref 0.1–1.0)
MONOS PCT: 8 % (ref 3–12)
NEUTROS ABS: 6.1 10*3/uL (ref 1.7–7.7)
Neutrophils Relative %: 59 % (ref 43–77)
Platelets: 267 10*3/uL (ref 150–400)
RBC: 5.38 MIL/uL (ref 4.22–5.81)
RDW: 12.9 % (ref 11.5–15.5)
WBC: 10.4 10*3/uL (ref 4.0–10.5)

## 2014-03-16 LAB — SURGICAL PCR SCREEN
MRSA, PCR: NEGATIVE
Staphylococcus aureus: POSITIVE — AB

## 2014-03-16 LAB — COMPREHENSIVE METABOLIC PANEL
ALT: 217 U/L — AB (ref 0–53)
ANION GAP: 14 (ref 5–15)
AST: 283 U/L — ABNORMAL HIGH (ref 0–37)
Albumin: 4.2 g/dL (ref 3.5–5.2)
Alkaline Phosphatase: 162 U/L — ABNORMAL HIGH (ref 39–117)
BUN: 15 mg/dL (ref 6–23)
CALCIUM: 11.4 mg/dL — AB (ref 8.4–10.5)
CO2: 27 mEq/L (ref 19–32)
Chloride: 93 mEq/L — ABNORMAL LOW (ref 96–112)
Creatinine, Ser: 1.02 mg/dL (ref 0.50–1.35)
GFR calc non Af Amer: 85 mL/min — ABNORMAL LOW (ref >90)
GLUCOSE: 211 mg/dL — AB (ref 70–99)
Potassium: 3.6 mEq/L — ABNORMAL LOW (ref 3.7–5.3)
SODIUM: 134 meq/L — AB (ref 137–147)
TOTAL PROTEIN: 8.3 g/dL (ref 6.0–8.3)
Total Bilirubin: 1 mg/dL (ref 0.3–1.2)

## 2014-03-16 LAB — CREATININE, SERUM
Creatinine, Ser: 0.91 mg/dL (ref 0.50–1.35)
GFR calc non Af Amer: >90 mL/min (ref >90)

## 2014-03-16 LAB — GLUCOSE, CAPILLARY
GLUCOSE-CAPILLARY: 172 mg/dL — AB (ref 70–99)
GLUCOSE-CAPILLARY: 264 mg/dL — AB (ref 70–99)
GLUCOSE-CAPILLARY: 293 mg/dL — AB (ref 70–99)
Glucose-Capillary: 142 mg/dL — ABNORMAL HIGH (ref 70–99)
Glucose-Capillary: 158 mg/dL — ABNORMAL HIGH (ref 70–99)
Glucose-Capillary: 265 mg/dL — ABNORMAL HIGH (ref 70–99)

## 2014-03-16 LAB — CBC
HEMATOCRIT: 42.4 % (ref 39.0–52.0)
HEMOGLOBIN: 14.9 g/dL (ref 13.0–17.0)
MCH: 29.7 pg (ref 26.0–34.0)
MCHC: 35.1 g/dL (ref 30.0–36.0)
MCV: 84.5 fL (ref 78.0–100.0)
Platelets: 241 10*3/uL (ref 150–400)
RBC: 5.02 MIL/uL (ref 4.22–5.81)
RDW: 13.3 % (ref 11.5–15.5)
WBC: 9.9 10*3/uL (ref 4.0–10.5)

## 2014-03-16 LAB — SAMPLE TO BLOOD BANK

## 2014-03-16 LAB — CBG MONITORING, ED: Glucose-Capillary: 170 mg/dL — ABNORMAL HIGH (ref 70–99)

## 2014-03-16 LAB — LIPASE, BLOOD: Lipase: 24 U/L (ref 11–59)

## 2014-03-16 LAB — I-STAT CG4 LACTIC ACID, ED: Lactic Acid, Venous: 1.45 mmol/L (ref 0.5–2.2)

## 2014-03-16 SURGERY — LAPAROSCOPIC CHOLECYSTECTOMY WITH INTRAOPERATIVE CHOLANGIOGRAM
Anesthesia: General | Site: Abdomen

## 2014-03-16 MED ORDER — MORPHINE SULFATE 4 MG/ML IJ SOLN
4.0000 mg | Freq: Once | INTRAMUSCULAR | Status: AC
  Administered 2014-03-16: 4 mg via INTRAVENOUS
  Filled 2014-03-16: qty 1

## 2014-03-16 MED ORDER — HYDROMORPHONE HCL PF 1 MG/ML IJ SOLN
1.0000 mg | INTRAMUSCULAR | Status: DC | PRN
  Filled 2014-03-16: qty 1

## 2014-03-16 MED ORDER — GLYCOPYRROLATE 0.2 MG/ML IJ SOLN
INTRAMUSCULAR | Status: DC | PRN
  Administered 2014-03-16: .6 mg via INTRAVENOUS

## 2014-03-16 MED ORDER — LIDOCAINE HCL (CARDIAC) 20 MG/ML IV SOLN
INTRAVENOUS | Status: AC
  Filled 2014-03-16: qty 5

## 2014-03-16 MED ORDER — ENOXAPARIN SODIUM 40 MG/0.4ML ~~LOC~~ SOLN
40.0000 mg | SUBCUTANEOUS | Status: DC
  Administered 2014-03-17: 40 mg via SUBCUTANEOUS
  Filled 2014-03-16 (×2): qty 0.4

## 2014-03-16 MED ORDER — MORPHINE SULFATE 2 MG/ML IJ SOLN: 2.0000 mg | INTRAMUSCULAR | Status: DC | PRN

## 2014-03-16 MED ORDER — ONDANSETRON HCL 4 MG PO TABS: 4.0000 mg | ORAL_TABLET | Freq: Four times a day (QID) | ORAL | Status: DC | PRN

## 2014-03-16 MED ORDER — LOSARTAN POTASSIUM 50 MG PO TABS
100.0000 mg | ORAL_TABLET | Freq: Every day | ORAL | Status: DC
  Administered 2014-03-17: 100 mg via ORAL
  Filled 2014-03-16 (×2): qty 2

## 2014-03-16 MED ORDER — LACTATED RINGERS IV SOLN
INTRAVENOUS | Status: DC
  Administered 2014-03-16: 1000 mL via INTRAVENOUS

## 2014-03-16 MED ORDER — DEXAMETHASONE SODIUM PHOSPHATE 10 MG/ML IJ SOLN
INTRAMUSCULAR | Status: AC
  Filled 2014-03-16: qty 1

## 2014-03-16 MED ORDER — SODIUM CHLORIDE 0.9 % IV SOLN
INTRAVENOUS | Status: DC | PRN
  Administered 2014-03-16: 12:00:00 via INTRAVENOUS

## 2014-03-16 MED ORDER — DEXAMETHASONE SODIUM PHOSPHATE 4 MG/ML IJ SOLN
INTRAMUSCULAR | Status: DC | PRN
  Administered 2014-03-16: 10 mg via INTRAVENOUS

## 2014-03-16 MED ORDER — BUPIVACAINE-EPINEPHRINE 0.5% -1:200000 IJ SOLN
INTRAMUSCULAR | Status: DC | PRN
  Administered 2014-03-16: 15 mL

## 2014-03-16 MED ORDER — MIDAZOLAM HCL 2 MG/2ML IJ SOLN
INTRAMUSCULAR | Status: AC
  Filled 2014-03-16: qty 2

## 2014-03-16 MED ORDER — INSULIN GLARGINE 100 UNIT/ML ~~LOC~~ SOLN: 40.0000 [IU] | Freq: Every day | SUBCUTANEOUS | Status: DC

## 2014-03-16 MED ORDER — SUFENTANIL CITRATE 50 MCG/ML IV SOLN
INTRAVENOUS | Status: AC
  Filled 2014-03-16: qty 1

## 2014-03-16 MED ORDER — CHLORHEXIDINE GLUCONATE 4 % EX LIQD
1.0000 "application " | Freq: Once | CUTANEOUS | Status: DC
  Filled 2014-03-16: qty 15

## 2014-03-16 MED ORDER — SUFENTANIL CITRATE 50 MCG/ML IV SOLN
INTRAVENOUS | Status: DC | PRN
  Administered 2014-03-16 (×3): 10 ug via INTRAVENOUS
  Administered 2014-03-16: 20 ug via INTRAVENOUS

## 2014-03-16 MED ORDER — GLYCOPYRROLATE 0.2 MG/ML IJ SOLN
INTRAMUSCULAR | Status: AC
  Filled 2014-03-16: qty 3

## 2014-03-16 MED ORDER — CISATRACURIUM BESYLATE (PF) 10 MG/5ML IV SOLN
INTRAVENOUS | Status: DC | PRN
  Administered 2014-03-16: 6 mg via INTRAVENOUS
  Administered 2014-03-16: 2 mg via INTRAVENOUS

## 2014-03-16 MED ORDER — LACTATED RINGERS IR SOLN
Status: DC | PRN
  Administered 2014-03-16: 1000 mL

## 2014-03-16 MED ORDER — BUPIVACAINE-EPINEPHRINE (PF) 0.5% -1:200000 IJ SOLN
INTRAMUSCULAR | Status: AC
  Filled 2014-03-16: qty 30

## 2014-03-16 MED ORDER — NEOSTIGMINE METHYLSULFATE 10 MG/10ML IV SOLN
INTRAVENOUS | Status: DC | PRN
  Administered 2014-03-16: 4 mg via INTRAVENOUS

## 2014-03-16 MED ORDER — PROMETHAZINE HCL 25 MG/ML IJ SOLN: 6.2500 mg | INTRAMUSCULAR | Status: DC | PRN

## 2014-03-16 MED ORDER — OXYCODONE-ACETAMINOPHEN 5-325 MG PO TABS
1.0000 | ORAL_TABLET | ORAL | Status: DC | PRN
  Administered 2014-03-17: 1 via ORAL
  Filled 2014-03-16: qty 1

## 2014-03-16 MED ORDER — CIPROFLOXACIN IN D5W 400 MG/200ML IV SOLN
400.0000 mg | Freq: Two times a day (BID) | INTRAVENOUS | Status: DC
  Filled 2014-03-16 (×3): qty 200

## 2014-03-16 MED ORDER — IOHEXOL 300 MG/ML  SOLN
INTRAMUSCULAR | Status: DC | PRN
  Administered 2014-03-16: 9 mL

## 2014-03-16 MED ORDER — DOCUSATE SODIUM 100 MG PO CAPS
100.0000 mg | ORAL_CAPSULE | Freq: Two times a day (BID) | ORAL | Status: DC
  Filled 2014-03-16 (×2): qty 1

## 2014-03-16 MED ORDER — SUCCINYLCHOLINE CHLORIDE 20 MG/ML IJ SOLN
INTRAMUSCULAR | Status: DC | PRN
  Administered 2014-03-16: 100 mg via INTRAVENOUS

## 2014-03-16 MED ORDER — PROPOFOL 10 MG/ML IV BOLUS
INTRAVENOUS | Status: DC | PRN
  Administered 2014-03-16: 200 mg via INTRAVENOUS

## 2014-03-16 MED ORDER — LABETALOL HCL 5 MG/ML IV SOLN
5.0000 mg | INTRAVENOUS | Status: DC | PRN
  Administered 2014-03-16 (×2): 5 mg via INTRAVENOUS

## 2014-03-16 MED ORDER — INSULIN ASPART 100 UNIT/ML ~~LOC~~ SOLN
0.0000 [IU] | Freq: Every day | SUBCUTANEOUS | Status: DC
  Administered 2014-03-16: 3 [IU] via SUBCUTANEOUS

## 2014-03-16 MED ORDER — ENOXAPARIN SODIUM 40 MG/0.4ML ~~LOC~~ SOLN: 40.0000 mg | SUBCUTANEOUS | Status: DC

## 2014-03-16 MED ORDER — PROPOFOL 10 MG/ML IV BOLUS
INTRAVENOUS | Status: AC
  Filled 2014-03-16: qty 20

## 2014-03-16 MED ORDER — ONDANSETRON HCL 4 MG/2ML IJ SOLN
INTRAMUSCULAR | Status: DC | PRN
  Administered 2014-03-16: 4 mg via INTRAVENOUS

## 2014-03-16 MED ORDER — MUPIROCIN 2 % EX OINT
1.0000 "application " | TOPICAL_OINTMENT | Freq: Two times a day (BID) | CUTANEOUS | Status: DC
  Administered 2014-03-16: 1 via NASAL
  Filled 2014-03-16 (×2): qty 22

## 2014-03-16 MED ORDER — INSULIN ASPART 100 UNIT/ML ~~LOC~~ SOLN
0.0000 [IU] | Freq: Four times a day (QID) | SUBCUTANEOUS | Status: DC
Start: 2014-03-16 — End: 2014-03-17
  Administered 2014-03-16 (×2): 8 [IU] via SUBCUTANEOUS
  Administered 2014-03-16: 2 [IU] via SUBCUTANEOUS
  Administered 2014-03-17 (×2): 3 [IU] via SUBCUTANEOUS

## 2014-03-16 MED ORDER — CHLORHEXIDINE GLUCONATE CLOTH 2 % EX PADS
6.0000 | MEDICATED_PAD | Freq: Every day | CUTANEOUS | Status: DC
Start: 2014-03-17 — End: 2014-03-16

## 2014-03-16 MED ORDER — LABETALOL HCL 5 MG/ML IV SOLN
INTRAVENOUS | Status: AC
  Filled 2014-03-16: qty 4

## 2014-03-16 MED ORDER — CIPROFLOXACIN IN D5W 400 MG/200ML IV SOLN
INTRAVENOUS | Status: AC
  Filled 2014-03-16: qty 200

## 2014-03-16 MED ORDER — HYDROMORPHONE HCL PF 1 MG/ML IJ SOLN
INTRAMUSCULAR | Status: AC
  Filled 2014-03-16: qty 1

## 2014-03-16 MED ORDER — KCL IN DEXTROSE-NACL 20-5-0.45 MEQ/L-%-% IV SOLN
INTRAVENOUS | Status: DC
  Administered 2014-03-16: 10:00:00 via INTRAVENOUS
  Filled 2014-03-16: qty 1000

## 2014-03-16 MED ORDER — HYDROMORPHONE HCL PF 1 MG/ML IJ SOLN
0.2500 mg | INTRAMUSCULAR | Status: DC | PRN
  Administered 2014-03-16 (×2): 0.5 mg via INTRAVENOUS

## 2014-03-16 MED ORDER — AMLODIPINE BESYLATE 5 MG PO TABS
5.0000 mg | ORAL_TABLET | Freq: Every day | ORAL | Status: DC
  Administered 2014-03-17: 5 mg via ORAL
  Filled 2014-03-16 (×2): qty 1

## 2014-03-16 MED ORDER — EPHEDRINE SULFATE 50 MG/ML IJ SOLN
INTRAMUSCULAR | Status: DC | PRN
  Administered 2014-03-16 (×2): 5 mg via INTRAVENOUS

## 2014-03-16 MED ORDER — IOHEXOL 350 MG/ML SOLN
100.0000 mL | Freq: Once | INTRAVENOUS | Status: AC | PRN
  Administered 2014-03-16: 100 mL via INTRAVENOUS

## 2014-03-16 MED ORDER — CIPROFLOXACIN IN D5W 400 MG/200ML IV SOLN
400.0000 mg | Freq: Two times a day (BID) | INTRAVENOUS | Status: DC
  Administered 2014-03-16: 400 mg via INTRAVENOUS

## 2014-03-16 MED ORDER — POTASSIUM CHLORIDE IN NACL 20-0.9 MEQ/L-% IV SOLN
INTRAVENOUS | Status: DC
  Administered 2014-03-16 – 2014-03-17 (×2): via INTRAVENOUS
  Filled 2014-03-16 (×4): qty 1000

## 2014-03-16 MED ORDER — ONDANSETRON HCL 4 MG/2ML IJ SOLN
INTRAMUSCULAR | Status: AC
  Filled 2014-03-16: qty 2

## 2014-03-16 MED ORDER — SODIUM CHLORIDE 0.9 % IJ SOLN
INTRAMUSCULAR | Status: AC
  Filled 2014-03-16: qty 10

## 2014-03-16 MED ORDER — ONDANSETRON HCL 4 MG/2ML IJ SOLN
4.0000 mg | Freq: Four times a day (QID) | INTRAMUSCULAR | Status: DC | PRN
  Administered 2014-03-16: 4 mg via INTRAVENOUS
  Filled 2014-03-16: qty 2

## 2014-03-16 MED ORDER — CIPROFLOXACIN IN D5W 400 MG/200ML IV SOLN
400.0000 mg | Freq: Two times a day (BID) | INTRAVENOUS | Status: DC
  Administered 2014-03-16 – 2014-03-17 (×2): 400 mg via INTRAVENOUS
  Filled 2014-03-16 (×3): qty 200

## 2014-03-16 MED ORDER — MIDAZOLAM HCL 5 MG/5ML IJ SOLN
INTRAMUSCULAR | Status: DC | PRN
  Administered 2014-03-16: 1 mg via INTRAVENOUS

## 2014-03-16 SURGICAL SUPPLY — 32 items
ADH SKN CLS APL DERMABOND .7 (GAUZE/BANDAGES/DRESSINGS) ×1
APPLIER CLIP ROT 10 11.4 M/L (STAPLE) ×2
APR CLP MED LRG 11.4X10 (STAPLE) ×1
BAG SPEC RTRVL LRG 6X4 10 (ENDOMECHANICALS) ×1
CANISTER SUCTION 2500CC (MISCELLANEOUS) ×2 IMPLANT
CLIP APPLIE ROT 10 11.4 M/L (STAPLE) ×1 IMPLANT
COVER MAYO STAND STRL (DRAPES) ×2 IMPLANT
DECANTER SPIKE VIAL GLASS SM (MISCELLANEOUS) ×1 IMPLANT
DERMABOND ADVANCED (GAUZE/BANDAGES/DRESSINGS) ×1
DERMABOND ADVANCED .7 DNX12 (GAUZE/BANDAGES/DRESSINGS) ×1 IMPLANT
DRAPE C-ARM 42X120 X-RAY (DRAPES) ×2 IMPLANT
DRAPE LAPAROSCOPIC ABDOMINAL (DRAPES) ×2 IMPLANT
ELECT REM PT RETURN 9FT ADLT (ELECTROSURGICAL) ×2
ELECTRODE REM PT RTRN 9FT ADLT (ELECTROSURGICAL) ×1 IMPLANT
GLOVE EUDERMIC 7 POWDERFREE (GLOVE) ×11 IMPLANT
GOWN STRL REUS W/TWL XL LVL3 (GOWN DISPOSABLE) ×9 IMPLANT
HEMOSTAT SNOW SURGICEL 2X4 (HEMOSTASIS) ×1 IMPLANT
KIT BASIN OR (CUSTOM PROCEDURE TRAY) ×2 IMPLANT
POUCH SPECIMEN RETRIEVAL 10MM (ENDOMECHANICALS) ×2 IMPLANT
SCISSORS LAP 5X35 DISP (ENDOMECHANICALS) ×2 IMPLANT
SET CHOLANGIOGRAPH MIX (MISCELLANEOUS) ×2 IMPLANT
SET IRRIG TUBING LAPAROSCOPIC (IRRIGATION / IRRIGATOR) ×2 IMPLANT
SLEEVE XCEL OPT CAN 5 100 (ENDOMECHANICALS) ×2 IMPLANT
SOLUTION ANTI FOG 6CC (MISCELLANEOUS) ×2 IMPLANT
SUT MNCRL AB 4-0 PS2 18 (SUTURE) ×2 IMPLANT
TOWEL OR 17X26 10 PK STRL BLUE (TOWEL DISPOSABLE) ×2 IMPLANT
TOWEL OR NON WOVEN STRL DISP B (DISPOSABLE) ×2 IMPLANT
TRAY LAP CHOLE (CUSTOM PROCEDURE TRAY) ×2 IMPLANT
TROCAR BLADELESS OPT 5 100 (ENDOMECHANICALS) ×2 IMPLANT
TROCAR XCEL BLUNT TIP 100MML (ENDOMECHANICALS) ×2 IMPLANT
TROCAR XCEL NON-BLD 11X100MML (ENDOMECHANICALS) ×2 IMPLANT
TUBING INSUFFLATION 10FT LAP (TUBING) ×2 IMPLANT

## 2014-03-16 NOTE — H&P (Signed)
Peter Blake is an 48 y.o. male.   Chief Complaint: abd pain  HPI: Pt presents to emergency department from home with complaint of chest pain radiating into his back associated with nausea and vomiting. Patient had onset of pain yesterday around 4 PM. [place] [employer] [place], and was evaluated by EMS. He was told that his EKG was normal. He was recommended to go to the emergency room, but the patient wished to followup with his endocrinologist in the morning. Patient reports he has history of gallstones diagnosed about 18 months ago, and has not followed up since that time, but pain was similar and was relieved with a bowel movement. Patient returned home and ate a small amount, but then pain began to come back again about an hour after eating. Patient reports feeling sweaty. Patient has history of insulin controlled diabetes, hypertension, hyperlipidemia and reflux.   Past Medical History  Diagnosis Date  . Diabetes mellitus without complication   . Hyperlipidemia   . Hypertension   . GERD (gastroesophageal reflux disease)     History reviewed. No pertinent past surgical history.  History reviewed. No pertinent family history. Social History:  reports that he has never smoked. He does not have any smokeless tobacco history on file. He reports that he does not drink alcohol or use illicit drugs.  Allergies:  Allergies  Allergen Reactions  . Augmentin [Amoxicillin-Pot Clavulanate] Nausea And Vomiting  . Metformin And Related Diarrhea  . Invokana [Canagliflozin] Rash     (Not in a hospital admission)  Results for orders placed during the hospital encounter of 03/15/14 (from the past 48 hour(s))  COMPREHENSIVE METABOLIC PANEL     Status: Abnormal   Collection Time    03/15/14 11:55 PM      Result Value Ref Range   Sodium 134 (*) 137 - 147 mEq/L   Potassium 3.6 (*) 3.7 - 5.3 mEq/L   Chloride 93 (*) 96 - 112 mEq/L   CO2 27  19 - 32 mEq/L   Glucose, Bld 211 (*) 70  - 99 mg/dL   BUN 15  6 - 23 mg/dL   Creatinine, Ser 1.02  0.50 - 1.35 mg/dL   Calcium 11.4 (*) 8.4 - 10.5 mg/dL   Total Protein 8.3  6.0 - 8.3 g/dL   Albumin 4.2  3.5 - 5.2 g/dL   AST 283 (*) 0 - 37 U/L   ALT 217 (*) 0 - 53 U/L   Alkaline Phosphatase 162 (*) 39 - 117 U/L   Total Bilirubin 1.0  0.3 - 1.2 mg/dL   GFR calc non Af Amer 85 (*) >90 mL/min   GFR calc Af Amer >90  >90 mL/min   Comment: (NOTE)     The eGFR has been calculated using the CKD EPI equation.     This calculation has not been validated in all clinical situations.     eGFR's persistently <90 mL/min signify possible Chronic Kidney     Disease.   Anion gap 14  5 - 15  CBC WITH DIFFERENTIAL     Status: None   Collection Time    03/15/14 11:55 PM      Result Value Ref Range   WBC 10.4  4.0 - 10.5 K/uL   RBC 5.38  4.22 - 5.81 MIL/uL   Hemoglobin 16.0  13.0 - 17.0 g/dL   HCT 45.0  39.0 - 52.0 %   MCV 83.6  78.0 - 100.0 fL   MCH  29.7  26.0 - 34.0 pg   MCHC 35.6  30.0 - 36.0 g/dL   RDW 12.9  11.5 - 15.5 %   Platelets 267  150 - 400 K/uL   Neutrophils Relative % 59  43 - 77 %   Neutro Abs 6.1  1.7 - 7.7 K/uL   Lymphocytes Relative 30  12 - 46 %   Lymphs Abs 3.1  0.7 - 4.0 K/uL   Monocytes Relative 8  3 - 12 %   Monocytes Absolute 0.9  0.1 - 1.0 K/uL   Eosinophils Relative 3  0 - 5 %   Eosinophils Absolute 0.3  0.0 - 0.7 K/uL   Basophils Relative 0  0 - 1 %   Basophils Absolute 0.0  0.0 - 0.1 K/uL  SAMPLE TO BLOOD BANK     Status: None   Collection Time    03/15/14 11:55 PM      Result Value Ref Range   Blood Bank Specimen SAMPLE AVAILABLE FOR TESTING     Sample Expiration 03/18/2014    LIPASE, BLOOD     Status: None   Collection Time    03/15/14 11:55 PM      Result Value Ref Range   Lipase 24  11 - 59 U/L  I-STAT TROPOININ, ED     Status: None   Collection Time    03/16/14 12:08 AM      Result Value Ref Range   Troponin i, poc 0.00  0.00 - 0.08 ng/mL   Comment 3            Comment: Due to the  release kinetics of cTnI,     a negative result within the first hours     of the onset of symptoms does not rule out     myocardial infarction with certainty.     If myocardial infarction is still suspected,     repeat the test at appropriate intervals.  I-STAT CHEM 8, ED     Status: Abnormal   Collection Time    03/16/14 12:11 AM      Result Value Ref Range   Sodium 138  137 - 147 mEq/L   Potassium 3.7  3.7 - 5.3 mEq/L   Chloride 98  96 - 112 mEq/L   BUN 16  6 - 23 mg/dL   Creatinine, Ser 1.10  0.50 - 1.35 mg/dL   Glucose, Bld 205 (*) 70 - 99 mg/dL   Calcium, Ion 1.40 (*) 1.12 - 1.23 mmol/L   TCO2 29  0 - 100 mmol/L   Hemoglobin 17.0  13.0 - 17.0 g/dL   HCT 50.0  39.0 - 52.0 %  CBG MONITORING, ED     Status: Abnormal   Collection Time    03/16/14 12:11 AM      Result Value Ref Range   Glucose-Capillary 170 (*) 70 - 99 mg/dL  I-STAT CG4 LACTIC ACID, ED     Status: None   Collection Time    03/16/14 12:15 AM      Result Value Ref Range   Lactic Acid, Venous 1.45  0.5 - 2.2 mmol/L   Ct Angio Chest W/cm &/or Wo Cm  03/16/2014   CLINICAL DATA:  Severe chest and upper abdominal pain. Diaphoresis. Clinical concern for aortic dissection.  EXAM: CT ANGIOGRAPHY CHEST WITH CONTRAST  TECHNIQUE: Multidetector CT imaging of the chest was performed using the standard protocol during bolus administration of intravenous contrast. Multiplanar CT image reconstructions and MIPs were  obtained to evaluate the vascular anatomy.  CONTRAST:  159m OMNIPAQUE IOHEXOL 350 MG/ML SOLN  COMPARISON:  Portable chest obtained earlier today.  FINDINGS: Small to moderate-sized paraesophageal hiatal hernia. Multiple noncalcified gallstones in the gallbladder. No gallbladder wall thickening or pericholecystic fluid. Mild atheromatous changes in the thoracic aorta without aneurysm or dissection. The pulmonary arteries are normally opacified with no pulmonary emboli seen. No pneumothorax or pleural fluid. Prominent  pulmonary vasculature. Mildly prominent interstitial markings. No lung nodules or enlarged lymph nodes. Small amount of air within the left innominate vein, compatible with recent intravenous access. Mild linear atelectasis or scarring in the right middle lobe and lingula. Mild thoracic spine degenerative changes.  Review of the MIP images confirms the above findings.  IMPRESSION: 1. No aortic dissection or aneurysm. 2. Pulmonary vascular congestion. 3. Mild chronic interstitial lung disease. 4. Small to moderate-sized paraesophageal hiatal hernia. 5. Cholelithiasis.   Electronically Signed   By: SEnrique SackM.D.   On: 03/16/2014 00:55   Dg Chest Port 1 View  03/16/2014   CLINICAL DATA:  Chart chest pain.  Mild shortness of breath.  EXAM: PORTABLE CHEST - 1 VIEW  COMPARISON:  08/25/2012.  FINDINGS: Borderline enlarged cardiac silhouette. Prominent pulmonary vasculature. Mildly prominent interstitial markings without significant change. Unremarkable bones.  IMPRESSION: 1. Borderline cardiomegaly with pulmonary vascular congestion. 2. Stable mild chronic interstitial lung disease.   Electronically Signed   By: SEnrique SackM.D.   On: 03/16/2014 00:14   [place]dom[place] Limited Ruq  03/16/2014   CLINICAL DATA:  Chest pain, RUQ pain hx of cholelithiasis  EXAM: [place]DOMEN LIMITED - RIGHT UPPER QUADRANT  COMPARISON:  Prior CT from 08/25/2012  FINDINGS: Gallbladder:  Cholelithiasis evident with a Wes sign present. No pericholecystic fluid. Gallbladder wall was thickened measuring up to 8 mm, which may in part be related to contraction. Study was unable to evaluate for sonographic Murphy's sinus the patient was medicated.  Common bile duct:  Diameter: 6.6 mm  Liver:  No focal lesion identified. Within normal limits in parenchymal echogenicity.  IMPRESSION: Cholelithiasis without sonographic evidence of acute cholecystitis or biliary dilatation.   Electronically Signed   By: BJeannine BogaM.D.   On: 03/16/2014 02:09     Review of Systems  Constitutional: Negative for fever and chills.  Respiratory: Positive for cough. Negative for shortness of breath.   Cardiovascular: Positive for chest pain.  Gastrointestinal: Positive for nausea, vomiting and abdominal pain. Negative for diarrhea and constipation.  Genitourinary: Negative for dysuria, urgency and frequency.  Skin: Negative for rash.  Neurological: Negative for headaches.    Blood pressure 135/80, pulse 79, temperature 97.8 F (36.6 C), temperature source Oral, resp. rate 17, SpO2 97.00%. Physical Exam  Constitutional: He is oriented to person, place, and time. He appears well-developed and well-nourished. No distress.  HENT:  Head: Normocephalic and atraumatic.  Eyes: Conjunctivae are normal. Pupils are equal, round, and reactive to light.  Neck: Normal range of motion.  Cardiovascular: Normal rate and regular rhythm.   Respiratory: Effort normal and breath sounds normal. No respiratory distress.  GI: Soft. He exhibits no distension. There is tenderness. There is no rebound and no guarding.  RUQ tenderness to deep palpation  Musculoskeletal: Normal range of motion.  Neurological: He is alert and oriented to person, place, and time.  Skin: Skin is warm and dry.     Assessment/Plan KDicky Boeris a 48y.o. M with symptomatic cholelithiasis.  His LFT's are elevated but bilirubin  is normal.  I have recommended admission and cholecystectomy.  I have discussed this with the surgeon on call today, Dr [name].  Pt's wife also requesting w/u for an AM cough that the pt has had for the past few months.  I offered to set up an outpatient apt with GI upon discharge to have his reflux evaluated.    Maanya Hippert C. 4/43/9265, 9:97 AM

## 2014-03-16 NOTE — Progress Notes (Signed)
PACU note----pt's blood pressure elevated, Dr. Delma Post, anesthes, made aware, order rec'd and med given

## 2014-03-16 NOTE — ED Provider Notes (Signed)
CSN: [id number]     Arrival date & time 03/15/14  2250 History   First MD Initiated Contact with Patient 03/15/14 2344     Chief Complaint  Patient presents with  . Chest Pain  . Tailbone Pain     (Consider location/radiation/quality/duration/timing/severity/associated sxs/prior Treatment) HPI 48 year old male presents to emergency department from home with complaint of substernal chest pain radiating into his back associated with nausea and vomiting.  Patient had onset of pain today around 4 PM.  [place] [employer] [place], and was evaluated by EMS.  He was told that his EKG was normal.  He was recommended to go to the emergency room, but the patient wished to followup with his endocrinologist in the morning.  Patient reports he has history of gallstones diagnosed about 18 months ago, and has not followed up since that time.  Patient returned home and slept for about an hour, but then pain began to come back again.  Patient reports feeling sweaty.  Patient has history of diabetes hypertension hyperlipidemia and reflux. Past Medical History  Diagnosis Date  . Diabetes mellitus without complication   . Hyperlipidemia   . Hypertension   . GERD (gastroesophageal reflux disease)    History reviewed. No pertinent past surgical history. History reviewed. No pertinent family history. History  Substance Use Topics  . Smoking status: Never Smoker   . Smokeless tobacco: Not on file  . Alcohol Use: No    Review of Systems   See History of Present Illness; otherwise all other systems are reviewed and negative  Allergies  Augmentin; Metformin and related; and Invokana  Home Medications   Prior to Admission medications   Medication Sig Start Date End Date Taking? Authorizing Provider  amLODipine (NORVASC) 5 MG tablet Take 5 mg by mouth daily.   Yes Historical Provider, MD  ibuprofen (ADVIL,MOTRIN) 200 MG tablet Take 400-600 mg by mouth every 6 (six) hours as needed for  moderate pain.   Yes Historical Provider, MD  insulin glargine (LANTUS) 100 UNIT/ML injection Inject 40 Units into the skin at bedtime.    Yes Historical Provider, MD  insulin lispro (HUMALOG) 100 UNIT/ML injection Inject into the skin 3 (three) times daily before meals. 20-22 units with each meal   Yes Historical Provider, MD  losartan (COZAAR) 25 MG tablet Take 100 mg by mouth daily.    Yes Historical Provider, MD   BP 124/70  Pulse 72  Temp(Src) 97.8 F (36.6 C) (Oral)  Resp 23  SpO2 100% Physical Exam  Nursing note and vitals reviewed. Constitutional: He is oriented to person, place, and time. He appears well-developed and well-nourished. He appears distressed.  HENT:  Head: Normocephalic and atraumatic.  Nose: Nose normal.  Mouth/Throat: Oropharynx is clear and moist.  Eyes: Conjunctivae and EOM are normal. Pupils are equal, round, and reactive to light.  Neck: Normal range of motion. Neck supple. No JVD present. No tracheal deviation present. No thyromegaly present.  Cardiovascular: Normal rate, regular rhythm, normal heart sounds and intact distal pulses.  Exam reveals no gallop and no friction rub.   No murmur heard. Pulmonary/Chest: Effort normal and breath sounds normal. No stridor. No respiratory distress. He has no wheezes. He has no rales. He exhibits no tenderness.  Abdominal: Soft. Bowel sounds are normal. He exhibits no distension and no mass. There is tenderness. There is no rebound and no guarding.  Musculoskeletal: Normal range of motion. He exhibits no edema and no tenderness.  Lymphadenopathy:  He has no cervical adenopathy.  Neurological: He is alert and oriented to person, place, and time. He exhibits normal muscle tone. Coordination normal.  Skin: Skin is warm. No rash noted. He is diaphoretic. No erythema. No pallor.  Psychiatric: He has a normal mood and affect. His behavior is normal. Judgment and thought content normal.    ED Course  Procedures  (including critical care time) Labs Review Labs Reviewed  COMPREHENSIVE METABOLIC PANEL - Abnormal; Notable for the following:    Sodium 134 (*)    Potassium 3.6 (*)    Chloride 93 (*)    Glucose, Bld 211 (*)    Calcium 11.4 (*)    AST 283 (*)    ALT 217 (*)    Alkaline Phosphatase 162 (*)    GFR calc non Af Amer 85 (*)    All other components within normal limits  I-STAT CHEM 8, ED - Abnormal; Notable for the following:    Glucose, Bld 205 (*)    Calcium, Ion 1.40 (*)    All other components within normal limits  CBG MONITORING, ED - Abnormal; Notable for the following:    Glucose-Capillary 170 (*)    All other components within normal limits  CBC WITH DIFFERENTIAL  LIPASE, BLOOD  I-STAT CG4 LACTIC ACID, ED  I-STAT TROPOININ, ED  SAMPLE TO BLOOD BANK    Imaging Review Ct Angio Chest W/cm &/or Wo Cm  03/16/2014   CLINICAL DATA:  Severe chest and upper abdominal pain. Diaphoresis. Clinical concern for aortic dissection.  EXAM: CT ANGIOGRAPHY CHEST WITH CONTRAST  TECHNIQUE: Multidetector CT imaging of the chest was performed using the standard protocol during bolus administration of intravenous contrast. Multiplanar CT image reconstructions and MIPs were obtained to evaluate the vascular anatomy.  CONTRAST:  135mL OMNIPAQUE IOHEXOL 350 MG/ML SOLN  COMPARISON:  Portable chest obtained earlier today.  FINDINGS: Small to moderate-sized paraesophageal hiatal hernia. Multiple noncalcified gallstones in the gallbladder. No gallbladder wall thickening or pericholecystic fluid. Mild atheromatous changes in the thoracic aorta without aneurysm or dissection. The pulmonary arteries are normally opacified with no pulmonary emboli seen. No pneumothorax or pleural fluid. Prominent pulmonary vasculature. Mildly prominent interstitial markings. No lung nodules or enlarged lymph nodes. Small amount of air within the left innominate vein, compatible with recent intravenous access. Mild linear atelectasis  or scarring in the right middle lobe and lingula. Mild thoracic spine degenerative changes.  Review of the MIP images confirms the above findings.  IMPRESSION: 1. No aortic dissection or aneurysm. 2. Pulmonary vascular congestion. 3. Mild chronic interstitial lung disease. 4. Small to moderate-sized paraesophageal hiatal hernia. 5. Cholelithiasis.   Electronically Signed   By: Enrique Sack M.D.   On: 03/16/2014 00:55   Dg Chest Port 1 View  03/16/2014   CLINICAL DATA:  Chart chest pain.  Mild shortness of breath.  EXAM: PORTABLE CHEST - 1 VIEW  COMPARISON:  08/25/2012.  FINDINGS: Borderline enlarged cardiac silhouette. Prominent pulmonary vasculature. Mildly prominent interstitial markings without significant change. Unremarkable bones.  IMPRESSION: 1. Borderline cardiomegaly with pulmonary vascular congestion. 2. Stable mild chronic interstitial lung disease.   Electronically Signed   By: Enrique Sack M.D.   On: 03/16/2014 00:14   US Abdomen Limited Ruq  03/16/2014   CLINICAL DATA:  Chest pain, RUQ pain hx of cholelithiasis  EXAM: US ABDOMEN LIMITED - RIGHT UPPER QUADRANT  COMPARISON:  Prior CT from 08/25/2012  FINDINGS: Gallbladder:  Cholelithiasis evident with a Wes sign present. No pericholecystic  fluid. Gallbladder wall was thickened measuring up to 8 mm, which may in part be related to contraction. Study was unable to evaluate for sonographic Murphy's sinus the patient was medicated.  Common bile duct:  Diameter: 6.6 mm  Liver:  No focal lesion identified. Within normal limits in parenchymal echogenicity.  IMPRESSION: Cholelithiasis without sonographic evidence of acute cholecystitis or biliary dilatation.   Electronically Signed   By: Jeannine Boga M.D.   On: 03/16/2014 02:09     EKG Interpretation   Date/Time:  Wednesday March 15 2014 23:38:37 EDT Ventricular Rate:  83 PR Interval:  184 QRS Duration: 100 QT Interval:  359 QTC Calculation: 422 R Axis:   31 Text Interpretation:  Sinus  rhythm Borderline T wave abnormalities  Confirmed by Maysoon Lozada  MD, Shenicka Sunderlin ([id number]) on 03/15/2014 11:58:36 PM      MDM   Final diagnoses:  Cholelithiasis without cholecystitis  Elevated liver enzymes    48 year old male with substernal chest pain radiating into his back he described as a ripping tearing sensation.  Patient does have history of cholelithiasis, and this may be any aberrant presentation for biliary colic, but given his hypertension and diaphoresis, and overall unwell appearance, concern for possible aortic dissection.  Chest x-ray with slightly widened mediastinum.  Plan for CT angioma chest to further evaluate.  Pain and nausea medicine given, labs drawn. Plan of care discussed with patient and wife.  3:49 AM Pain starting to return.  Cholelithiasis with thickened gb wall, but no pericholecystic fluid.  Mild Murphy sign on exam.  D/w Dr [name] with surgery who will see /admit.    Kalman Drape, MD 03/16/14 [phone]

## 2014-03-16 NOTE — Op Note (Signed)
Patient Name:           Peter Blake   Date of Surgery:        03/16/2014  Note: This dictation was prepared with Dragon/digital dictation along with [facility] technology. Any transcriptional errors that result from this process are unintentional.   Pre op Diagnosis:      Acute cholecystitis with cholelithiasis  Post op Diagnosis:    Same  Procedure:                 Laparoscopic cholecystectomy with intraoperative cholangiogram  Surgeon:                     Edsel Petrin. [name], M.D., FACS  Assistant:                      None  Operative Indications:   This is a 48 year old Caucasian male who presents with recent onset of epigastric and back pain. He was found to be mildly tender in the epigastrium and right upper quadrant. Transaminase is an oblong phosphatase were elevated the bilirubin was normal. Ultrasound showed numerous gallstones a slightly thickened gallbladder wall. He was admitted, started on antibiotics and brought to the operating room for cholecystectomy.  Operative Findings:       The gallbladder was thick walled and edematous and acutely inflamed. It was difficult to dissect from the liver due to chronic inflammation as well as acute inflammation. The cystic duct was very tiny but was patent. The cholangiogram was normal, showing normal intrahepatic and extrahepatic biliary anatomy, no filling defects, and no obstruction with good flow of contrast into the duodenum. The gallbladder itself was partially intrahepatic and the back wall of the fundus was actually presenting through the superior wall of the liver. The stomach, duodenum, small intestine, and large intestine were grossly normal.  Procedure in Detail:          Following the induction of general endotracheal anesthesia the patient's abdomen was prepped and draped in a sterile fashion. Intravenous antibiotics were given. Surgical time out was performed. 0.5 % Marcaine with epinephrine was used as local infiltration  anesthetic.    A vertical incision was made at the superior rim of the umbilicus. An 11 mm Hassan trocar was inserted with an open technique and secured with a  pursestring suture of 0 Vicryl. Pneumoperitoneum was created and a video camera inserted. An 11 millimeter trocar was placed in the  subxiphoid region and two 5 mm trocars placed in the right upper quadrant.The gallbladder fundus was identified. Although very thickened we were able to grasp this. With elevation we could see the infundibulum and grasped it. Using electrocautery and the hook cautery I slowly dissected the peritoneum off the neck of the gallbladder. I identified and isolated the anterior branch of the cystic artery, secured it with metal clips and divided it.   I was able to create a nice window behind the cystic duct. Cholangiogram catheter was inserted into the cystic duct and a cholangiogram catheter obtained using the C-arm. The cholangiogram was normal as described above.  I discussed the imaging study with Dr. Zigmund Daniel and radiology who agreed. The cholangiogram catheter was removed, the cystic duct secured with multiple medical clips and divided.  I found a posterior branch of the cystic artery, controlled it with endoclips and divided. The gallbladder was then dissected from its bed with electrocautery, placed in a specimen bag and removed. Because of the anatomy the gallbladder  this left a defect in the anterior edge of the right lobe of the liver, but there was no bleeding. Hemostasis was excellent and electrocautery and the operative field was irrigated. A piece of hemostatic sponge was placed in the bed of the gallbladder. All irrigation fluid was removed. The trocars were removed and there was no bleeding from the trocar sites. Pneumoperitoneum was released. The fascia at the umbilicus was closed with 0 Vicryl sutures and the skin closed with subcuticular suture of 4-0 Monocryl and Dermabond. The patient tolerated the procedure  well was taken to PACU in stable condition. EBL 25 cc. Counts correct. Complications none.     Edsel Petrin. [name], M.D., FACS General and Minimally Invasive Surgery Breast and Colorectal Surgery  03/16/2014 12:29 PM

## 2014-03-16 NOTE — ED Notes (Signed)
Per pt, started having upper abdominal pain at around 4 pm.  Had EMS check pt out and EKG looked normal .  Pt felt possible gas d/t belching.  Pt remained home.  At arrival to ED, pain is worse and radiating to back.  Upon triage assessment, pt became pale and diaphoretic.  Pt brought to RES A for MD to evaluate.  IV started, labs drawn and port CXR completed.  Pending CT at this time

## 2014-03-16 NOTE — ED Notes (Signed)
Patient transported to CT 

## 2014-03-16 NOTE — Anesthesia Preprocedure Evaluation (Addendum)
Anesthesia Evaluation  Patient identified by MRN, date of birth, ID band Patient awake    Reviewed: Allergy & Precautions, H&P , NPO status , Patient's Chart, lab work & pertinent test results  Airway Mallampati: II TM Distance: >3 FB Neck ROM: Full    Dental no notable dental hx.    Pulmonary neg pulmonary ROS,  breath sounds clear to auscultation  Pulmonary exam normal       Cardiovascular hypertension, Pt. on medications Rhythm:Regular Rate:Normal     Neuro/Psych Numbness bilateral lower extremities after accident. Left > Right negative psych ROS   GI/Hepatic Neg liver ROS, GERD-  ,  Endo/Other  diabetes, Type 1, Insulin Dependent  Renal/GU negative Renal ROS  negative genitourinary   Musculoskeletal negative musculoskeletal ROS (+)   Abdominal   Peds negative pediatric ROS (+)  Hematology negative hematology ROS (+)   Anesthesia Other Findings   Reproductive/Obstetrics negative OB ROS                         Anesthesia Physical Anesthesia Plan  ASA: III  Anesthesia Plan: General   Post-op Pain Management:    Induction: Intravenous  Airway Management Planned: Oral ETT  Additional Equipment:   Intra-op Plan:   Post-operative Plan: Extubation in OR  Informed Consent: I have reviewed the patients History and Physical, chart, labs and discussed the procedure including the risks, benefits and alternatives for the proposed anesthesia with the patient or authorized representative who has indicated his/her understanding and acceptance.   Dental advisory given  Plan Discussed with: CRNA  Anesthesia Plan Comments:         Anesthesia Quick Evaluation

## 2014-03-16 NOTE — ED Notes (Signed)
Pt requesting something to drink.  Pt notified per Dr. Sharol Given, pt to remain NPO

## 2014-03-16 NOTE — Transfer of Care (Signed)
Immediate Anesthesia Transfer of Care Note  Patient: Peter Blake  Procedure(s) Performed: Procedure(s): LAPAROSCOPIC CHOLECYSTECTOMY WITH INTRAOPERATIVE CHOLANGIOGRAM (N/A)  Patient Location: PACU  Anesthesia Type:General  Level of Consciousness: awake  Airway & Oxygen Therapy: Patient Spontanous Breathing and Patient connected to face mask oxygen  Post-op Assessment: Report given to PACU RN and Post -op Vital signs reviewed and stable  Post vital signs: Reviewed and stable  Complications: No apparent anesthesia complications

## 2014-03-16 NOTE — ED Notes (Signed)
BP rechecked by Dr. Sharol Given on left arm. Pt post morphine.  Pt lying on right side.

## 2014-03-16 NOTE — Anesthesia Procedure Notes (Signed)
Procedure Name: Intubation Date/Time: 03/16/2014 11:02 AM Performed by: Danley Danker L Patient Re-evaluated:Patient Re-evaluated prior to inductionOxygen Delivery Method: Circle system utilized Preoxygenation: Pre-oxygenation with 100% oxygen Intubation Type: IV induction, Rapid sequence and Cricoid Pressure applied Laryngoscope Size: Miller and 3 Grade View: Grade I Tube type: Oral Tube size: 8.0 mm Number of attempts: 1 Airway Equipment and Method: Stylet Placement Confirmation: ETT inserted through vocal cords under direct vision,  positive ETCO2 and breath sounds checked- equal and bilateral Secured at: 22 cm Tube secured with: Tape Dental Injury: Teeth and Oropharynx as per pre-operative assessment

## 2014-03-16 NOTE — Care Management Note (Signed)
    Page 1 of 1   03/16/2014     3:34:28 PM CARE MANAGEMENT NOTE 03/16/2014  Patient:  Peter Blake,Peter Blake   Account Number:  [id number]  Date Initiated:  03/16/2014  Documentation initiated by:  Sunday Spillers  Subjective/Objective Assessment:   48 yo male admitted s/p lap chole. PTA lived at home with spouse.     Action/Plan:   Home when stable   Anticipated DC Date:  03/16/2014   Anticipated DC Plan:  [place]  CM consult      Choice offered to / List presented to:             Status of service:  Completed, signed off Medicare Important Message given?   (If response is "NO", the following Medicare IM given date fields will be blank) Date Medicare IM given:   Medicare IM given by:   Date Additional Medicare IM given:   Additional Medicare IM given by:    Discharge Disposition:  HOME/SELF CARE  Per UR Regulation:  Reviewed for med. necessity/level of care/duration of stay  If discussed at [place] of Stay Meetings, dates discussed:    Comments:

## 2014-03-16 NOTE — Progress Notes (Signed)
Gen. Surgery:  I have interviewed this patient, examined him, and reviewed his workup today. I have discussed his care with Dr. Leighton Ruff. His PCP is Dr. Antony Contras  at [place] [facility], although he does not see him routinely. His endocrinologist is Dr. Gareth Eagle.  He certainly has chronic cholecystitis with cholelithiasis and may have a component of acute cholecystitis. Because of his elevated liver function tests I think there is increased risk for common bile duct stones, and I discussed this with him and his wife. Since his bilirubin is normal do not think there is any indication for MRCP or ERCP preop.  We will plan to taken to the operating room today for laparoscopic cholecystectomy with cholangiogram. If he has CBD stones we'll get GI involved.  I discussed the indications, details, techniques, and numerous risks of the surgery with him. He is aware of the risk of bleeding, infection, conversion to open laparotomy, postop bile leak, injury to adjacent organs with major reconstructive surgery, wound hernia, cardiac, pulmonary, and thromboembolic problems.  He is advised that he is at increased risk of infection and wound healing problems. He understands all these issues. All his questions were answered. He and his wife agree with this plan.   Peter Blake. [name], M.D., [facility] General and Minimally invasive Surgery Breast and Colorectal Surgery Office:   [phone] Pager:   [phone]

## 2014-03-17 ENCOUNTER — Encounter (HOSPITAL_COMMUNITY): Payer: Self-pay | Admitting: General Surgery

## 2014-03-17 ENCOUNTER — Encounter: Payer: Self-pay | Admitting: General Surgery

## 2014-03-17 LAB — CBC
HCT: 39 % (ref 39.0–52.0)
Hemoglobin: 13.2 g/dL (ref 13.0–17.0)
MCH: 28.8 pg (ref 26.0–34.0)
MCHC: 33.8 g/dL (ref 30.0–36.0)
MCV: 85.2 fL (ref 78.0–100.0)
Platelets: 232 10*3/uL (ref 150–400)
RBC: 4.58 MIL/uL (ref 4.22–5.81)
RDW: 13.2 % (ref 11.5–15.5)
WBC: 9.4 10*3/uL (ref 4.0–10.5)

## 2014-03-17 LAB — COMPREHENSIVE METABOLIC PANEL
ALBUMIN: 3.4 g/dL — AB (ref 3.5–5.2)
ALK PHOS: 194 U/L — AB (ref 39–117)
ALT: 369 U/L — AB (ref 0–53)
AST: 202 U/L — ABNORMAL HIGH (ref 0–37)
Anion gap: 13 (ref 5–15)
BILIRUBIN TOTAL: 1 mg/dL (ref 0.3–1.2)
BUN: 13 mg/dL (ref 6–23)
CHLORIDE: 100 meq/L (ref 96–112)
CO2: 24 mEq/L (ref 19–32)
CREATININE: 0.94 mg/dL (ref 0.50–1.35)
Calcium: 9.2 mg/dL (ref 8.4–10.5)
GFR calc Af Amer: >90 mL/min (ref >90)
Glucose, Bld: 190 mg/dL — ABNORMAL HIGH (ref 70–99)
Potassium: 4.6 mEq/L (ref 3.7–5.3)
Sodium: 137 mEq/L (ref 137–147)
Total Protein: 6.8 g/dL (ref 6.0–8.3)

## 2014-03-17 LAB — GLUCOSE, CAPILLARY
GLUCOSE-CAPILLARY: 191 mg/dL — AB (ref 70–99)
Glucose-Capillary: 151 mg/dL — ABNORMAL HIGH (ref 70–99)

## 2014-03-17 LAB — HEMOGLOBIN A1C
Hgb A1c MFr Bld: 9.3 % — ABNORMAL HIGH (ref <5.7)
Mean Plasma Glucose: 220 mg/dL — ABNORMAL HIGH (ref <117)

## 2014-03-17 LAB — LIPASE, BLOOD: LIPASE: 16 U/L (ref 11–59)

## 2014-03-17 MED ORDER — OXYCODONE-ACETAMINOPHEN 5-325 MG PO TABS: 1.0000 | ORAL_TABLET | ORAL | Status: DC | PRN

## 2014-03-17 NOTE — Progress Notes (Signed)
UR completed 

## 2014-03-17 NOTE — Discharge Summary (Signed)
Patient seen and examined. See my earlier note. Agree with discharge plans.  Peter Blake. [name], M.D., [facility] General and Minimally invasive Surgery Breast and Colorectal Surgery Office:   [phone] Pager:   [phone]

## 2014-03-17 NOTE — Discharge Summary (Signed)
Patient seen and examined. Agree with above note. Agree with discharge plans and followup.   Peter Blake. [name], M.D., [facility] General and Minimally invasive Surgery Breast and Colorectal Surgery

## 2014-03-17 NOTE — Anesthesia Postprocedure Evaluation (Signed)
  Anesthesia Post-op Note  Patient: Peter Blake  Procedure(s) Performed: Procedure(s) (LRB): LAPAROSCOPIC CHOLECYSTECTOMY WITH INTRAOPERATIVE CHOLANGIOGRAM (N/A)  Patient Location: PACU  Anesthesia Type: General  Level of Consciousness: awake and alert   Airway and Oxygen Therapy: Patient Spontanous Breathing  Post-op Pain: mild  Post-op Assessment: Post-op Vital signs reviewed, Patient's Cardiovascular Status Stable, Respiratory Function Stable, Patent Airway and No signs of Nausea or vomiting  Last Vitals:  Filed Vitals:   03/17/14 0922  BP: 138/81  Pulse: 83  Temp:   Resp:     Post-op Vital Signs: stable   Complications: No apparent anesthesia complications

## 2014-03-17 NOTE — Discharge Instructions (Signed)
Laparoscopic Cholecystectomy, Care After °Refer to this sheet in the next few weeks. These instructions provide you with information on caring for yourself after your procedure. Your health care provider may also give you more specific instructions. Your treatment has been planned according to current medical practices, but problems sometimes occur. Call your health care provider if you have any problems or questions after your procedure. °WHAT TO EXPECT AFTER THE PROCEDURE °After your procedure, it is typical to have the following: °· Pain at your incision sites. You will be given pain medicines to control the pain. °· Mild nausea or vomiting. This should improve after the first 24 hours. °· Bloating and possibly shoulder pain from the gas used during the procedure. This will improve after the first 24 hours. °HOME CARE INSTRUCTIONS  °· Change bandages (dressings) as directed by your health care provider. °· Keep the wound dry and clean. You may wash the wound gently with soap and water. Gently blot or dab the area dry. °· Do not take baths or use swimming pools or hot tubs for 2 weeks or until your health care provider approves. °· Only take over-the-counter or prescription medicines as directed by your health care provider. °· Continue your normal diet as directed by your health care provider. °· Do not lift anything heavier than 10 pounds (4.5 kg) until your health care provider approves. °· Do not play contact sports for 1 week or until your health care provider approves. °SEEK MEDICAL CARE IF:  °· You have redness, swelling, or increasing pain in the wound. °· You notice yellowish-white fluid (pus) coming from the wound. °· You have drainage from the wound that lasts longer than 1 day. °· You notice a bad smell coming from the wound or dressing. °· Your surgical cuts (incisions) break open. °SEEK IMMEDIATE MEDICAL CARE IF:  °· You develop a rash. °· You have difficulty breathing. °· You have chest pain. °· You  have a fever. °· You have increasing pain in the shoulders (shoulder strap areas). °· You have dizzy episodes or faint while standing. °· You have severe abdominal pain. °· You feel sick to your stomach (nauseous) or throw up (vomit) and this lasts for more than 1 day. °Document Released: 07/07/2005 Document Revised: 04/27/2013 Document Reviewed: 02/16/2013 °ExitCare® Patient Information ©2015 [employer]. This information is not intended to replace advice given to you by your health care provider. Make sure you discuss any questions you have with your health care provider. ° °CCS ______[facility] °LAPAROSCOPIC SURGERY: POST OP INSTRUCTIONS °Always review your discharge instruction sheet given to you by the facility where your surgery was performed. °IF YOU HAVE DISABILITY OR FAMILY LEAVE FORMS, YOU MUST BRING THEM TO THE OFFICE FOR PROCESSING.   °DO NOT GIVE THEM TO YOUR DOCTOR. ° °1. A prescription for pain medication may be given to you upon discharge.  Take your pain medication as prescribed, if needed.  If narcotic pain medicine is not needed, then you may take acetaminophen (Tylenol) or ibuprofen (Advil) as needed. °2. Take your usually prescribed medications unless otherwise directed. °3. If you need a refill on your pain medication, please contact your pharmacy.  They will contact our office to request authorization. Prescriptions will not be filled after 5pm or on week-ends. °4. You should follow a light diet the first few days after arrival home, such as soup and crackers, etc.  Be sure to include lots of fluids daily. °5. Most patients will experience some   swelling and bruising in the area of the incisions.  Ice packs will help.  Swelling and bruising can take several days to resolve.  6. It is common to experience some constipation if taking pain medication after surgery.  Increasing fluid intake and taking a stool softener (such as Colace) will usually help or prevent this problem  from occurring.  A mild laxative (Milk of Magnesia or Miralax) should be taken according to package instructions if there are no bowel movements after 48 hours. 7. Unless discharge instructions indicate otherwise, you may remove your bandages 24-48 hours after surgery, and you may shower at that time.  You may have steri-strips (small skin tapes) in place directly over the incision.  These strips should be left on the skin for 7-10 days.  If your surgeon used skin glue on the incision, you may shower in 24 hours.  The glue will flake off over the next 2-3 weeks.  Any sutures or staples will be removed at the office during your follow-up visit. 8. ACTIVITIES:  You may resume regular (light) daily activities beginning the next day--such as daily self-care, walking, climbing stairs--gradually increasing activities as tolerated.  You may have sexual intercourse when it is comfortable.  Refrain from any heavy lifting or straining until approved by your doctor. a. You may drive when you are no longer taking prescription pain medication, you can comfortably wear a seatbelt, and you can safely maneuver your car and apply brakes. b. RETURN TO WORK:  __________________________________________________________ 9. You should see your doctor in the office for a follow-up appointment approximately 2-3 weeks after your surgery.  Make sure that you call for this appointment within a day or two after you arrive home to insure a convenient appointment time. 10. OTHER INSTRUCTIONS: __________________________________________________________________________________________________________________________ __________________________________________________________________________________________________________________________ WHEN TO CALL YOUR DOCTOR: 1. Fever over 101.0 2. Inability to urinate 3. Continued bleeding from incision. 4. Increased pain, redness, or drainage from the incision. 5. Increasing abdominal pain  The  clinic staff is available to answer your questions during regular business hours.  Please dont hesitate to call and ask to speak to one of the nurses for clinical concerns.  If you have a medical emergency, go to the nearest emergency room or call 911.  A surgeon from [facility] is always on call at the hospital. [address], [place], [place], [place]  [address] ? [address] [place], [place], [place]   [address] [phone] ? [phone] ? FAX [phone] Web site: [email or web address]  Basic Carbohydrate Counting for Diabetes Mellitus Carbohydrate counting is a method for keeping track of the amount of carbohydrates you eat. Eating carbohydrates naturally increases the level of sugar (glucose) in your blood, so it is important for you to know the amount that is okay for you to have in every meal. Carbohydrate counting helps keep the level of glucose in your blood within normal limits. The amount of carbohydrates allowed is different for every person. A dietitian can help you calculate the amount that is right for you. Once you know the amount of carbohydrates you can have, you can count the carbohydrates in the foods you want to eat. Carbohydrates are found in the following foods:  Grains, such as breads and cereals.  Dried beans and soy products.  Starchy vegetables, such as potatoes, peas, and corn.  Fruit and fruit juices.  Milk and yogurt.  Sweets and snack foods, such as cake, cookies, candy, chips, soft drinks, and fruit drinks. CARBOHYDRATE COUNTING There  are two ways to count the carbohydrates in your food. You can use either of the methods or a combination of both. Reading the "Nutrition Facts" on [place] The "Nutrition Facts" is an area that is included on the labels of almost all packaged food and beverages in the [place]. It includes the serving size of that food or beverage and information about the nutrients in each serving of the  food, including the grams (g) of carbohydrate per serving.  Decide the number of servings of this food or beverage that you will be able to eat or drink. Multiply that number of servings by the number of grams of carbohydrate that is listed on the label for that serving. The total will be the amount of carbohydrates you will be having when you eat or drink this food or beverage. Learning Standard Serving Sizes of Food When you eat food that is not packaged or does not include "Nutrition Facts" on the label, you need to measure the servings in order to count the amount of carbohydrates.A serving of most carbohydrate-rich foods contains about 15 g of carbohydrates. The following list includes serving sizes of carbohydrate-rich foods that provide 15 g ofcarbohydrate per serving:   1 slice of bread (1 oz) or 1 six-inch tortilla.    of a hamburger bun or English muffin.  4-6 crackers.   cup unsweetened dry cereal.    cup hot cereal.   cup rice or pasta.    cup mashed potatoes or  of a large baked potato.  1 cup fresh fruit or one small piece of fruit.    cup canned or frozen fruit or fruit juice.  1 cup milk.   cup plain fat-free yogurt or yogurt sweetened with artificial sweeteners.   cup cooked dried beans or starchy vegetable, such as peas, corn, or potatoes.  Decide the number of standard-size servings that you will eat. Multiply that number of servings by 15 (the grams of carbohydrates in that serving). For example, if you eat 2 cups of strawberries, you will have eaten 2 servings and 30 g of carbohydrates (2 servings x 15 g = 30 g). For foods such as soups and casseroles, in which more than one food is mixed in, you will need to count the carbohydrates in each food that is included. EXAMPLE OF CARBOHYDRATE COUNTING Sample Dinner  3 oz chicken breast.   cup of brown rice.   cup of corn.  1 cup milk.   1 cup strawberries with sugar-free whipped topping.   Carbohydrate Calculation Step 1: Identify the foods that contain carbohydrates:   Rice.   Corn.   Milk.   Strawberries. Step 2:Calculate the number of servings eaten of each:   2 servings of rice.   1 serving of corn.   1 serving of milk.   1 serving of strawberries. Step 3: Multiply each of those number of servings by 15 g:   2 servings of rice x 15 g = 30 g.   1 serving of corn x 15 g = 15 g.   1 serving of milk x 15 g = 15 g.   1 serving of strawberries x 15 g = 15 g. Step 4: Add together all of the amounts to find the total grams of carbohydrates eaten: 30 g + 15 g + 15 g + 15 g = 75 g. Document Released: 07/07/2005 Document Revised: 11/21/2013 Document Reviewed: 06/03/2013 [facility] Patient Information 2015 [place], [place]. This information is not  intended to replace advice given to you by your health care provider. Make sure you discuss any questions you have with your health care provider. ° °

## 2014-03-17 NOTE — Progress Notes (Signed)
1 Day Post-Op  Subjective: Doing fairly well. Ate solid food for supper last night. Ambulating in halls and  voiding without difficulty. Pain well-controlled. Afebrile. Vital signs stable.  I discussed intraoperative findings a cholangiogram findings with him.  Last CBG 191. Liver function tests and lipase pending. WBC 9400. Hemoglobin 13.2.  Objective: Vital signs in last 24 hours: Temp:  [97.4 F (36.3 C)-98.2 F (36.8 C)] 97.4 F (36.3 C) (08/28 0215) Pulse Rate:  [65-97] 65 (08/28 0215) Resp:  [9-18] 18 (08/28 0215) BP: (102-175)/(56-103) 110/56 mmHg (08/28 0215) SpO2:  [93 %-100 %] 95 % (08/28 0215) Last BM Date: 03/15/14  Intake/Output from previous day: 08/27 0701 - 08/28 0700 In: 3600.2 [P.O.:276; I.V.:3324.2] Out: 1450 [Urine:1400; Blood:50] Intake/Output this shift: Total I/O In: 1626.7 [P.O.:240; I.V.:1386.7] Out: 1000 [Urine:1000]  General appearance: aalert. Cooperative. No distress. Mental status normal. Wife present. Resp: clear to auscultation bilaterally GI: abdomen is very soft. Nondistended. Almost no tenderness. Wounds looked good.  Lab Results:  Results for orders placed during the hospital encounter of 03/15/14 (from the past 24 hour(s))  SURGICAL PCR SCREEN     Status: Abnormal   Collection Time    03/16/14  9:03 AM      Result Value Ref Range   MRSA, PCR NEGATIVE  NEGATIVE   Staphylococcus aureus POSITIVE (*) NEGATIVE  GLUCOSE, CAPILLARY     Status: Abnormal   Collection Time    03/16/14  9:08 AM      Result Value Ref Range   Glucose-Capillary 142 (*) 70 - 99 mg/dL  GLUCOSE, CAPILLARY     Status: Abnormal   Collection Time    03/16/14 10:34 AM      Result Value Ref Range   Glucose-Capillary 158 (*) 70 - 99 mg/dL   Comment 1 Documented in Chart    GLUCOSE, CAPILLARY     Status: Abnormal   Collection Time    03/16/14 12:52 PM      Result Value Ref Range   Glucose-Capillary 172 (*) 70 - 99 mg/dL  CBC     Status: None   Collection Time     03/16/14  2:39 PM      Result Value Ref Range   WBC 9.9  4.0 - 10.5 K/uL   RBC 5.02  4.22 - 5.81 MIL/uL   Hemoglobin 14.9  13.0 - 17.0 g/dL   HCT 42.4  39.0 - 52.0 %   MCV 84.5  78.0 - 100.0 fL   MCH 29.7  26.0 - 34.0 pg   MCHC 35.1  30.0 - 36.0 g/dL   RDW 13.3  11.5 - 15.5 %   Platelets 241  150 - 400 K/uL  CREATININE, SERUM     Status: None   Collection Time    03/16/14  2:39 PM      Result Value Ref Range   Creatinine, Ser 0.91  0.50 - 1.35 mg/dL   GFR calc non Af Amer >90  >90 mL/min   GFR calc Af Amer >90  >90 mL/min  GLUCOSE, CAPILLARY     Status: Abnormal   Collection Time    03/16/14  5:10 PM      Result Value Ref Range   Glucose-Capillary 264 (*) 70 - 99 mg/dL  GLUCOSE, CAPILLARY     Status: Abnormal   Collection Time    03/16/14  8:36 PM      Result Value Ref Range   Glucose-Capillary 293 (*) 70 - 99 mg/dL  GLUCOSE, CAPILLARY  Status: Abnormal   Collection Time    03/16/14 10:02 PM      Result Value Ref Range   Glucose-Capillary 265 (*) 70 - 99 mg/dL  GLUCOSE, CAPILLARY     Status: Abnormal   Collection Time    03/17/14  2:12 AM      Result Value Ref Range   Glucose-Capillary 191 (*) 70 - 99 mg/dL  CBC     Status: None   Collection Time    03/17/14  5:10 AM      Result Value Ref Range   WBC 9.4  4.0 - 10.5 K/uL   RBC 4.58  4.22 - 5.81 MIL/uL   Hemoglobin 13.2  13.0 - 17.0 g/dL   HCT 39.0  39.0 - 52.0 %   MCV 85.2  78.0 - 100.0 fL   MCH 28.8  26.0 - 34.0 pg   MCHC 33.8  30.0 - 36.0 g/dL   RDW 13.2  11.5 - 15.5 %   Platelets 232  150 - 400 K/uL     Studies/Results: Dg Cholangiogram Operative  03/16/2014   CLINICAL DATA:  Cholelithiasis  EXAM: INTRAOPERATIVE CHOLANGIOGRAM  TECHNIQUE: Cholangiographic images from the C-arm fluoroscopic device were submitted for interpretation post-operatively. Please see the procedural report for the amount of contrast and the fluoroscopy time utilized.  COMPARISON:  None.  FINDINGS: Injection in the cystic duct  remnant reveals opacification of the intrahepatic and extrahepatic biliary tree. No filling defects are noted. Free flow contrast material into the duodenum is seen. There is also reflux into the pancreatic duct which appears normal in appearance. 18 seconds of fluoroscopy was utilized during this exam.   Electronically Signed   By: Inez Catalina M.D.   On: 03/16/2014 12:06   Ct Angio Chest W/cm &/or Wo Cm  03/16/2014   CLINICAL DATA:  Severe chest and upper abdominal pain. Diaphoresis. Clinical concern for aortic dissection.  EXAM: CT ANGIOGRAPHY CHEST WITH CONTRAST  TECHNIQUE: Multidetector CT imaging of the chest was performed using the standard protocol during bolus administration of intravenous contrast. Multiplanar CT image reconstructions and MIPs were obtained to evaluate the vascular anatomy.  CONTRAST:  129mL OMNIPAQUE IOHEXOL 350 MG/ML SOLN  COMPARISON:  Portable chest obtained earlier today.  FINDINGS: Small to moderate-sized paraesophageal hiatal hernia. Multiple noncalcified gallstones in the gallbladder. No gallbladder wall thickening or pericholecystic fluid. Mild atheromatous changes in the thoracic aorta without aneurysm or dissection. The pulmonary arteries are normally opacified with no pulmonary emboli seen. No pneumothorax or pleural fluid. Prominent pulmonary vasculature. Mildly prominent interstitial markings. No lung nodules or enlarged lymph nodes. Small amount of air within the left innominate vein, compatible with recent intravenous access. Mild linear atelectasis or scarring in the right middle lobe and lingula. Mild thoracic spine degenerative changes.  Review of the MIP images confirms the above findings.  IMPRESSION: 1. No aortic dissection or aneurysm. 2. Pulmonary vascular congestion. 3. Mild chronic interstitial lung disease. 4. Small to moderate-sized paraesophageal hiatal hernia. 5. Cholelithiasis.   Electronically Signed   By: Enrique Sack M.D.   On: 03/16/2014 00:55   Dg  Chest Port 1 View  03/16/2014   CLINICAL DATA:  Chart chest pain.  Mild shortness of breath.  EXAM: PORTABLE CHEST - 1 VIEW  COMPARISON:  08/25/2012.  FINDINGS: Borderline enlarged cardiac silhouette. Prominent pulmonary vasculature. Mildly prominent interstitial markings without significant change. Unremarkable bones.  IMPRESSION: 1. Borderline cardiomegaly with pulmonary vascular congestion. 2. Stable mild chronic interstitial lung disease.  Electronically Signed   By: Enrique Sack M.D.   On: 03/16/2014 00:14   US Abdomen Limited Ruq  03/16/2014   CLINICAL DATA:  Chest pain, RUQ pain hx of cholelithiasis  EXAM: US ABDOMEN LIMITED - RIGHT UPPER QUADRANT  COMPARISON:  Prior CT from 08/25/2012  FINDINGS: Gallbladder:  Cholelithiasis evident with a Wes sign present. No pericholecystic fluid. Gallbladder wall was thickened measuring up to 8 mm, which may in part be related to contraction. Study was unable to evaluate for sonographic Murphy's sinus the patient was medicated.  Common bile duct:  Diameter: 6.6 mm  Liver:  No focal lesion identified. Within normal limits in parenchymal echogenicity.  IMPRESSION: Cholelithiasis without sonographic evidence of acute cholecystitis or biliary dilatation.   Electronically Signed   By: Jeannine Boga M.D.   On: 03/16/2014 02:09    . amLODipine  5 mg Oral Daily  . ciprofloxacin  400 mg Intravenous Q12H  . enoxaparin (LOVENOX) injection  40 mg Subcutaneous Q24H  . insulin aspart  0-15 Units Subcutaneous Q6H  . insulin aspart  0-5 Units Subcutaneous QHS  . insulin glargine  40 Units Subcutaneous QHS  . losartan  100 mg Oral Daily  . mupirocin ointment  1 application Nasal BID     Assessment/Plan: s/p Procedure(s): LAPAROSCOPIC CHOLECYSTECTOMY WITH INTRAOPERATIVE CHOLANGIOGRAM  POD #1. Laparoscopic cholecystectomy with cholangiogram for acute cholecystitis with cholelithiasis. Stable. Good progress overnight Probable discharge today after  breakfast  Abnormal LFT's.Probably secondary to gallbladder inflammation.  Normal cholangiogram. Check C-met  this morning prior to discharge  IDDM  HTN  Diet, wound care and activities discussed. Follow up discussed.  _0 @  LOS: 2 days    Adda Stokes M 03/17/2014  . .prob

## 2014-03-17 NOTE — Progress Notes (Signed)
03/17/14  0930  Reviewed discharge instructions with patient. Patient verbalized understanding of discharge instructions. Copy of discharge papers, prescription, and note for work given to patient.

## 2014-03-17 NOTE — Discharge Summary (Addendum)
Physician Discharge Summary  Patient ID: Peter Blake MRN: [id number] DOB/AGE: [birth year] 49 y.o.  Admit date: 03/15/2014 Discharge date: 03/17/2014  Admission Diagnoses:  Acute cholecystitis with cholelithiasis  DIABETES Hypertension Hyperlipidemia  GERD    Discharge Diagnoses:  Same  Active Problems:   Cholelithiasis   Cholecystitis, acute with cholelithiasis   PROCEDURES: Laparoscopic cholecystectomy with intraoperative cholangiogram, 03/16/2014, Peter Hector, MD.      Hospital Course: Pt presents to emergency department from home with complaint of chest pain radiating into his back associated with nausea and vomiting. Patient had onset of pain yesterday around 4 PM. [place], and was evaluated by EMS. He was told that his EKG was normal. He was recommended to go to the emergency room, but the patient wished to followup with his endocrinologist in the morning. Patient reports he has history of gallstones diagnosed about 18 months ago, and has not followed up since that time, but pain was similar and was relieved with a bowel movement. Patient returned home and ate a small amount, but then pain began to come back again about an hour after eating. Patient reports feeling sweaty. Patient has history of insulin controlled diabetes, hypertension, hyperlipidemia and reflux.   He was admitted by Dr. [name] and taken to the OR in the AM later that day for surgery.  His diet has been advanced and he is tolerating carb modified diet.  We plan discharge home today. Pt reports he has not been very good at maintaining his diabetes.  I have offered diabetic teaching, but he will follow up with Dr. [name] and his office for more treatment, and education.  I have sent d/c summary to both PCP and Dr. [name].  Condition on d/c:  improved    Disposition: 01-Home or Self Care     Medication List    ASK your doctor about these medications       amLODipine  5 MG tablet  Commonly known as:  NORVASC  Take 5 mg by mouth daily.     ibuprofen 200 MG tablet  Commonly known as:  ADVIL,MOTRIN  Take 400-600 mg by mouth every 6 (six) hours as needed for moderate pain.     insulin glargine 100 UNIT/ML injection  Commonly known as:  LANTUS  Inject 40 Units into the skin at bedtime.     insulin lispro 100 UNIT/ML injection  Commonly known as:  HUMALOG  Inject into the skin 3 (three) times daily before meals. 20-22 units with each meal     losartan 25 MG tablet  Commonly known as:  COZAAR  Take 100 mg by mouth daily.         SignedEarnstine Regal 03/17/2014, 9:01 AM

## 2014-12-29 ENCOUNTER — Other Ambulatory Visit: Payer: Self-pay | Admitting: *Deleted

## 2015-01-15 ENCOUNTER — Other Ambulatory Visit: Payer: Self-pay

## 2016-07-03 ENCOUNTER — Encounter: Payer: 59 | Attending: Endocrinology | Admitting: Skilled Nursing Facility1

## 2016-07-03 ENCOUNTER — Encounter: Payer: Self-pay | Admitting: Skilled Nursing Facility1

## 2016-07-03 DIAGNOSIS — E7800 Pure hypercholesterolemia, unspecified: Secondary | ICD-10-CM | POA: Diagnosis not present

## 2016-07-03 DIAGNOSIS — Z713 Dietary counseling and surveillance: Secondary | ICD-10-CM | POA: Insufficient documentation

## 2016-07-03 DIAGNOSIS — E119 Type 2 diabetes mellitus without complications: Secondary | ICD-10-CM

## 2016-07-03 DIAGNOSIS — Z6828 Body mass index (BMI) 28.0-28.9, adult: Secondary | ICD-10-CM | POA: Diagnosis not present

## 2016-07-03 DIAGNOSIS — E782 Mixed hyperlipidemia: Secondary | ICD-10-CM | POA: Diagnosis not present

## 2016-07-03 NOTE — Progress Notes (Signed)
Pt states he was diagnosed with diabetes 2008 at which he was 142 pounds and an A1C of 11 or 12. Pt states he was diagnosed at Type 2 but now his doctor tells him he has type 1 diabetes. Pt states in 3 months he got his A1C from 11 to 6.5. Pt states his wife helps him to stay on track with his DM management.  Pt states as of 4 months ago his A1C is 10 and then got it back down to 6.7. Pt states his triglycerides are over 300.  Pt states he checks his blood sugar about 3 times a day: fasting 100-130 and then between an hour and 2 hours 160-180; 153 in the middle of the day. Pt stats he outs about 3 times a week. Pt states he eats deep fried foods once a week. Pt states he is not counting his carbohydrates for his insulin he states he is mostly guessing. Dietitian offered some benefits to having an insulin pump and suggested he follow up with Jeanie Sewer RD,LDN,CDE who is a pump educator pt states he will think about it, pt did accept the flyer to Type 1 diabetes support group where he can ask questions about the insulin pumps.  Goals: -Maybe talk about Afrezza with your doctor  -Maybe reconsider the pump  -consistency and Structure are key to diabetes management    -Alternative sugars are a great idea  -Look into trying out fish recipes to give yourself more variety   -On the food label: 0 grams Trans fat and for total fat 9 grams or less  -Look up a tofu recipe  -Liquid fat like oil are plant based and better for your heart  -Your carb choices are a place to start, you decide where to go from there  -Try to be active throughout the week  Handouts given: Yellow-Card with triglyceride improving foods circled   Dietitian educated the pt on diabetes and triglycerides.

## 2016-07-03 NOTE — Patient Instructions (Addendum)
-  Maybe talk about Afrezza with your doctor  -Maybe reconsider the pump  -consistency and Structure are key to diabetes management    -Alternative sugars are a great idea  -Look into trying out fish recipes to give yourself more variety   -On the food label: 0 grams Trans fat and for total fat 9 grams or less  -Look up a tofu recipe  -Liquid fat like oil are plant based and better for your heart  -Your carb choices are a place to start, you decide where to go from there  -Try to be active throughout the week

## 2017-04-20 DIAGNOSIS — E118 Type 2 diabetes mellitus with unspecified complications: Secondary | ICD-10-CM | POA: Diagnosis not present

## 2017-04-20 DIAGNOSIS — E789 Disorder of lipoprotein metabolism, unspecified: Secondary | ICD-10-CM | POA: Diagnosis not present

## 2017-04-20 DIAGNOSIS — R079 Chest pain, unspecified: Secondary | ICD-10-CM | POA: Diagnosis not present

## 2017-04-20 DIAGNOSIS — I10 Essential (primary) hypertension: Secondary | ICD-10-CM | POA: Diagnosis not present

## 2017-04-21 ENCOUNTER — Telehealth: Payer: Self-pay | Admitting: Cardiology

## 2017-04-21 ENCOUNTER — Encounter: Payer: Self-pay | Admitting: Gastroenterology

## 2017-04-21 NOTE — Telephone Encounter (Signed)
Received records from [facility] for appointment on 04/27/17 with Dr Martinique.  Records put with Dr Doug Sou schedule for 04/27/17. lp

## 2017-04-27 ENCOUNTER — Ambulatory Visit: Payer: 59 | Admitting: Cardiology

## 2017-05-14 DIAGNOSIS — E118 Type 2 diabetes mellitus with unspecified complications: Secondary | ICD-10-CM | POA: Diagnosis not present

## 2017-05-14 DIAGNOSIS — Z23 Encounter for immunization: Secondary | ICD-10-CM | POA: Diagnosis not present

## 2017-05-14 DIAGNOSIS — E109 Type 1 diabetes mellitus without complications: Secondary | ICD-10-CM | POA: Diagnosis not present

## 2017-05-14 DIAGNOSIS — I10 Essential (primary) hypertension: Secondary | ICD-10-CM | POA: Diagnosis not present

## 2017-05-14 DIAGNOSIS — E7800 Pure hypercholesterolemia, unspecified: Secondary | ICD-10-CM | POA: Diagnosis not present

## 2017-05-28 DIAGNOSIS — Z125 Encounter for screening for malignant neoplasm of prostate: Secondary | ICD-10-CM | POA: Diagnosis not present

## 2017-05-28 DIAGNOSIS — E118 Type 2 diabetes mellitus with unspecified complications: Secondary | ICD-10-CM | POA: Diagnosis not present

## 2017-05-28 DIAGNOSIS — I10 Essential (primary) hypertension: Secondary | ICD-10-CM | POA: Diagnosis not present

## 2017-06-04 DIAGNOSIS — E119 Type 2 diabetes mellitus without complications: Secondary | ICD-10-CM | POA: Diagnosis not present

## 2017-06-04 DIAGNOSIS — Z0000 Encounter for general adult medical examination without abnormal findings: Secondary | ICD-10-CM | POA: Diagnosis not present

## 2017-06-04 DIAGNOSIS — E7800 Pure hypercholesterolemia, unspecified: Secondary | ICD-10-CM | POA: Diagnosis not present

## 2017-06-17 DIAGNOSIS — E103312 Type 1 diabetes mellitus with moderate nonproliferative diabetic retinopathy with macular edema, left eye: Secondary | ICD-10-CM | POA: Diagnosis not present

## 2017-06-17 DIAGNOSIS — E103391 Type 1 diabetes mellitus with moderate nonproliferative diabetic retinopathy without macular edema, right eye: Secondary | ICD-10-CM | POA: Diagnosis not present

## 2017-06-17 DIAGNOSIS — H35371 Puckering of macula, right eye: Secondary | ICD-10-CM | POA: Diagnosis not present

## 2017-06-18 ENCOUNTER — Encounter: Payer: 59 | Admitting: Gastroenterology

## 2017-06-22 ENCOUNTER — Institutional Professional Consult (permissible substitution): Payer: 59 | Admitting: Internal Medicine

## 2017-07-21 HISTORY — PX: CORONARY STENT PLACEMENT: SHX1402

## 2017-07-30 DIAGNOSIS — I10 Essential (primary) hypertension: Secondary | ICD-10-CM | POA: Diagnosis not present

## 2017-07-30 DIAGNOSIS — R0609 Other forms of dyspnea: Secondary | ICD-10-CM | POA: Diagnosis not present

## 2017-08-06 DIAGNOSIS — R0609 Other forms of dyspnea: Secondary | ICD-10-CM | POA: Diagnosis not present

## 2017-08-06 DIAGNOSIS — E7800 Pure hypercholesterolemia, unspecified: Secondary | ICD-10-CM | POA: Diagnosis not present

## 2017-08-06 DIAGNOSIS — I10 Essential (primary) hypertension: Secondary | ICD-10-CM | POA: Diagnosis not present

## 2017-08-10 ENCOUNTER — Institutional Professional Consult (permissible substitution): Payer: 59 | Admitting: Internal Medicine

## 2017-08-17 DIAGNOSIS — I10 Essential (primary) hypertension: Secondary | ICD-10-CM | POA: Diagnosis not present

## 2017-08-17 DIAGNOSIS — R0609 Other forms of dyspnea: Secondary | ICD-10-CM | POA: Diagnosis not present

## 2017-08-17 DIAGNOSIS — J849 Interstitial pulmonary disease, unspecified: Secondary | ICD-10-CM | POA: Diagnosis not present

## 2017-08-21 DIAGNOSIS — R0602 Shortness of breath: Secondary | ICD-10-CM | POA: Diagnosis not present

## 2017-08-27 DIAGNOSIS — R0602 Shortness of breath: Secondary | ICD-10-CM | POA: Diagnosis not present

## 2017-08-28 DIAGNOSIS — R0602 Shortness of breath: Secondary | ICD-10-CM | POA: Diagnosis not present

## 2017-09-01 DIAGNOSIS — E119 Type 2 diabetes mellitus without complications: Secondary | ICD-10-CM | POA: Diagnosis not present

## 2017-09-02 DIAGNOSIS — R0609 Other forms of dyspnea: Secondary | ICD-10-CM | POA: Diagnosis not present

## 2017-09-02 DIAGNOSIS — I771 Stricture of artery: Secondary | ICD-10-CM | POA: Diagnosis not present

## 2017-09-02 DIAGNOSIS — N529 Male erectile dysfunction, unspecified: Secondary | ICD-10-CM | POA: Diagnosis not present

## 2017-09-08 DIAGNOSIS — E118 Type 2 diabetes mellitus with unspecified complications: Secondary | ICD-10-CM | POA: Diagnosis not present

## 2017-09-08 DIAGNOSIS — I10 Essential (primary) hypertension: Secondary | ICD-10-CM | POA: Diagnosis not present

## 2017-09-08 DIAGNOSIS — E7800 Pure hypercholesterolemia, unspecified: Secondary | ICD-10-CM | POA: Diagnosis not present

## 2017-09-11 DIAGNOSIS — R0609 Other forms of dyspnea: Secondary | ICD-10-CM | POA: Diagnosis not present

## 2017-09-11 DIAGNOSIS — I10 Essential (primary) hypertension: Secondary | ICD-10-CM | POA: Diagnosis not present

## 2017-09-11 DIAGNOSIS — Z0189 Encounter for other specified special examinations: Secondary | ICD-10-CM | POA: Diagnosis not present

## 2017-09-11 DIAGNOSIS — J849 Interstitial pulmonary disease, unspecified: Secondary | ICD-10-CM | POA: Diagnosis not present

## 2017-09-17 DIAGNOSIS — Z0189 Encounter for other specified special examinations: Secondary | ICD-10-CM | POA: Diagnosis not present

## 2017-09-17 DIAGNOSIS — I10 Essential (primary) hypertension: Secondary | ICD-10-CM | POA: Diagnosis not present

## 2017-09-20 DIAGNOSIS — I739 Peripheral vascular disease, unspecified: Secondary | ICD-10-CM | POA: Diagnosis present

## 2017-09-20 NOTE — H&P (Signed)
OFFICE VISIT NOTES COPIED TO EPIC FOR DOCUMENTATION  . History of Present Illness Peter Page MD; 09/12/2017 6:27 [name] Patient words: Last OV 08/17/2017; [name], echo, abi's, abd duplex.  The patient is a 52 year old male who presents for a Follow-up for Erectile dysfunction.  Additional reasons for visit:  Follow-up for Shortness of breath is described as the following: Peter Blake is a pleasant Caucasian male with worsening shortness breath and dyspnea on exertion in the past 2-3 months. He is also being worked up for IPF and sleep apneaa by Dr. Chase Caller. Due to multiple cardiovascular risk factors, due to marked worsening dyspnea, disproportionate to physical exam, he was referred to [place]. His cardiovascular risk factors include uncontrolled type I diabetes, mixed hyperlipidemia, hypertension.  Both of them have been extremely concerned about erectile dysfunction. He has tried medications and also injectables and has been evaluated by urology but has not been successful. He also complains of weakness in his legs, states that most he can walk 1/2 mile and feels extremely weak and fatigued in his legs. He had not given his history previously.  He underwent abdominal aortic duplex which revealed no evidence abdominal aortic aneurysm by distal abdominal aortic velocity had increased suggesting hemodynamically significant stenosis. ABI mildly reduced however calcified vessels are noncompressible. Echocardiogram revealed normal LVEF with mild to moderate amount. Nuclear stress test revealed a large area of inferior and inferolateral small scar but mostly reversible ischemia, high risk stress test. All tests were done in February 2019. He is now accompanied by his wife and presents here to discuss the results.   Problem List/Past Medical Peter Blake; 09/11/2017 3:29 [name] Acid reflux (K21.9)  Ventral hernia (K43.9)  Diabetic retinopathy (E11.319)  Laboratory examination  (Z01.89)  Labs 05/24/2017: Potassium 5.4, serum glucose 152, BUN 16, creatinine 1.11, eGFR 76. CMP otherwise normal. AST, ALT normal. Total cholesterol 227, triglycerides 194, HDL 29, LDL 159. CBC normal. Interstitial lung disease (J84.9)  Nocturnal oximetry 08/21/2017: Less than 88% SpO2 9.4 minutes, less than 89% 1.2 minutes, lowest SpO2 70%. Oxygen desaturation events 3%. Patient qualifies for nocturnal oxygen supplementation, mildly abnormal nocturnal oximetry. CT Chest 2015: 1. No aortic dissection or aneurysm. 2. Pulmonary vascular congestion. 3. Mild chronic interstitial lung disease. 4. Small to moderate-sized paraesophageal hiatal hernia.5. Cholelithiasis. Dyspnea on exertion (R06.09)  EKG 08/17/2017: Normal sinus rhythm at rate of 82 bpm, normal axis. Nonspecific T abnormality. Echocardiogram 09/02/2017: Left ventricle cavity is normal in size. Moderate concentric hypertrophy of the left ventricle. Normal global wall motion. Doppler evidence of grade I (impaired) diastolic dysfunction, normal LAP. Calculated EF 55%. Mild to moderate mitral regurgitation. Inadequate tricuspid regurgitation jet to estimate pulmonary artery pressure. Normal right atrial pressure. Benign essential hypertension (I10)  Mixed hyperlipidemia (E78.2)  Uncontrolled type 1 diabetes mellitus with hyperglycemia (E10.65)  Erectile dysfunction of organic origin (N52.9)  Abdominal aortic duplex 09/02/2017: Diffuse plaque noted in the proximal, mid and distal aorta. The maximum aorta diameter is 2.12 cm (prox). Diffuse plaque observed in the proximal, mid and distal aorta. Peak systolic velocities in the distal aorta are moderately increased to 252.39 cm/s and may suggest hemodynamically significant stenosis of >50%. Mildly abnormal iliac artery waveform without hemodynamically significant stenosis. Clinical correlation is suggested. Normal IVC respiratory variation. Stenosis of iliac artery (I77.1)  ABI 09/02/2017: This exam  reveals moderately decreased perfusion of the right lower extremity, noted at the dorsalis pedis artery level (ABI 0.69) and mildly decreased perfusion of the left lower extremity,  noted at the dorsalis pedis artery level (ABI 0.85). Moderately abnormal waveforms of the right ankle. Mildly abnormal waveforms of the left ankle. Bilateral posterior tibial arteries were non-compressible to >275mHg. Likely due to patient's controlled DM1.  Allergies (Peter Blake; [id number] [name] MetFORMIN HCl *ANTIDIABETICS*  Nausea. Atorvastatin Calcium *ANTIHYPERLIPIDEMICS*  Severe myalgia Crestor *ANTIHYPERLIPIDEMICS*  Severe myalgia Lisinopril *ANTIHYPERTENSIVES*  Cough.  Family History (Peter [name] Family history unknown - Adopted   Social History (Peter Blake [id number] [name] Current tobacco use  Never smoker. Non Drinker/No Alcohol Use  Marital status  Married. Living Situation  Lives with spouse. Number of Children  2.  Past Surgical History (Peter [name] Cholecystectomy [2014]:  Medication History (Peter Blake 2[birth year] 23, 20193:33 [name] Aspirin (81MG Tablet Chewable, 1 (one) Tablet Oral daily, Taken starting 08/17/2017) Active. Livalo (1MG Tablet, 1 Oral three times weekly, Taken starting 08/17/2017) Active. AmLODIPine Besylate (10MG Tablet, 1 Oral daily) Active. Losartan Potassium (100MG Tablet, 1 Oral daily) Active. Basaglar KwikPen (100UNIT/ML Soln Pen-inj, 60u Subcutaneous at bedtime) Active. HumaLOG (100UNIT/ML Solution, 30u sliding scale Subcutaneous three times daily) Active. HydroCHLOROthiazide (12.5MG Tablet, 1 Oral every morning) Active. Medications Reconciled (verbally)  Diagnostic Studies History (Peter Blake [id number] [name] Nuclear stress test  08/28/2017: 1. The patient performed treadmill exercise using a Bruce protocol, completing 7:30 minutes. The patient completed an estimated workload of 8.95  METS, reaching 85% maximum predicted heart rate. Normal resting blood pressure with exaggerated hypertensive response on exercise. Low normal exercise capacity for age. Stress symptoms included dyspnea 2. Stress electrocardiogram is positive for ischemia with inferior T wave inversions. 3. The overall quality of the study is excellent. Left ventricular cavity is noted to be normal on the rest and stress studies. Gated SPECT imaging demonstrates hypokinesis of the basal inferior, basal inferolateral, mid inferior and mid inferolateral myocardial wall(s). The left ventricular ejection fraction was calculated or visually estimated to be 42%. SPECT images demonstrate large, predominantly reversible perfusion abnormality of severe intensity in the basal inferior, basal inferolateral, mid inferior and mid inferolateral myocardial wall(s) on the stress images. This suggests small area of infarct with medium sized peri infarct ischemia in LCx/PDA perfusion territory 4. This is a high risk study. Abdominal Ultrasound  09/02/2017: Diffuse plaque noted in the proximal, mid and distal aorta. The maximum aorta diameter is 2.12 cm (prox). Diffuse plaque observed in the proximal, mid and distal aorta. Peak systolic velocities in the distal aorta are moderately increased to 252.39 cm/s and may suggest hemodynamically significant stenosis of >50%. Mildly abnormal iliac artery waveform without hemodynamically significant stenosis. Clinical correlation is suggested.  Normal IVC respiratory variation. [name]  09/02/2017: This exam reveals moderately decreased perfusion of the right lower extremity, noted at the dorsalis pedis artery level (ABI 0.69) and mildly decreased perfusion of the left lower extremity, noted at the dorsalis pedis artery level (ABI 0.85). Moderately abnormal waveforms of the right ankle. Mildly abnormal waveforms of the left ankle. Bilateral posterior tibial arteries were non-compressible to >2069mg.  Likely due to patient's controlled DM1. Echocardiogram  09/02/2017: Left ventricle cavity is normal in size. Moderate concentric hypertrophy of the left ventricle. Normal global wall motion. Doppler evidence of grade I (impaired) diastolic dysfunction, normal LAP. Calculated EF 55%. Mild to moderate mitral regurgitation. Inadequate tricuspid regurgitation jet to estimate pulmonary artery pressure. Normal right atrial pressure.    Review of Systems (Peter Blake; 09/12/2017 6:26 [name] General Present- Tiredness. Not Present- Appetite Loss and Weight Gain.  Respiratory Present- Difficulty Breathing on Exertion and Snoring (severe). Not Present- Chronic Cough, Sputum Production, Wakes up from Sleep Wheezing or Short of Breath and Wheezing. Cardiovascular Present- Claudications. Not Present- Chest Pain and Night Cramps. Gastrointestinal Not Present- Black, Tarry Stool and Difficulty Swallowing. Male Genitourinary Present- Impotence. Musculoskeletal Not Present- Decreased Range of Motion and Muscle Atrophy. Neurological Not Present- Attention Deficit. Psychiatric Not Present- Personality Changes and Suicidal Ideation. Endocrine Not Present- Cold Intolerance and Heat Intolerance. Hematology Not Present- Abnormal Bleeding. All other systems negative  Vitals Peter Blake; 09/11/2017 3:35 [name] 09/11/2017 3:35 Blake Weight: 200.44 lb Height: 68in Body Surface Area: 2.05 m Body Mass Index: 30.48 kg/m  BP: 118/76 (Sitting, Right Arm, Standard)    09/11/2017 3:30 Blake Weight: 200.44 lb Height: 68in Body Surface Area: 2.05 m Body Mass Index: 30.48 kg/m  Pulse: 80 (Regular)  P.OX: 98% (Room air) BP: 110/82 (Sitting, Left Arm, Standard)       Physical Exam Peter Page, MD; 09/12/2017 6:26 [name] General Mental Status-Alert. General Appearance-Cooperative and Appears stated age. Build & Nutrition-Well built and Mildly obese.  Head and Neck Thyroid Gland  Characteristics - normal size and consistency and no palpable nodules.  Chest and Lung Exam Chest and lung exam reveals -quiet, even and easy respiratory effort with no use of accessory muscles and non-tender. Auscultation Adventitious sounds - End inspiratory coarse crackles - Both Lung Fields(at the bases).  Cardiovascular Cardiovascular examination reveals -normal heart sounds, regular rate and rhythm with no murmurs, carotid auscultation reveals no bruits and abdominal aorta auscultation reveals no bruits and no prominent pulsation.  Abdomen Palpation/Percussion Normal exam - Non Tender and No hepatosplenomegaly.  Peripheral Vascular Lower Extremity Inspection - Bilateral - Shiny atrophic skin, No Varicose veins. Palpation - Temperature - Bilateral - Normal. Edema - Bilateral - Trace edema. Femoral pulse - Bilateral - 1+. Popliteal pulse - Bilateral - 1+. Dorsalis pedis pulse - Bilateral - Feeble. Posterior tibial pulse - Bilateral - Normal. Carotid arteries - Bilateral-No Carotid bruit.  Neurologic Neurologic evaluation reveals -alert and oriented x 3 with no impairment of recent or remote memory. Motor-Grossly intact without any focal deficits.  Musculoskeletal Global Assessment Left Lower Extremity - no deformities, masses or tenderness, no known fractures. Right Lower Extremity - no deformities, masses or tenderness, no known fractures.  Echocardiography, transthoracic, real-time with image documentation (2D), includes M-mode recording, when performed, complete, with spectral Doppler echocardiography, and with color flow Doppler echocardiography ([id number]) Comments: Echocardiogram 09/02/2017: Left ventricle cavity is normal in size. Moderate concentric hypertrophy of the left ventricle. Normal global wall motion. Doppler evidence of grade I (impaired) diastolic dysfunction, normal LAP. Calculated EF 55%. Mild to moderate mitral regurgitation. Inadequate tricuspid  regurgitation jet to estimate pulmonary artery pressure. Normal right atrial pressure.  Performed: 09/02/2017 11:43 AM Duplex scan of aorta, inferior vena cava, iliac vasculature, or bypass grafts; complete study ([id number]) Comments: Abdominal aortic duplex 09/02/2017: Diffuse plaque noted in the proximal, mid and distal aorta. The maximum aorta diameter is 2.12 cm (prox). Diffuse plaque observed in the proximal, mid and distal aorta. Peak systolic velocities in the distal aorta are moderately increased to 252.39 cm/s and may suggest hemodynamically significant stenosis of >50%. Mildly abnormal iliac artery waveform without hemodynamically significant stenosis. Clinical correlation is suggested.  Normal IVC respiratory variation.  Performed: 09/02/2017 11:44 AM CV - Ankle Brachial Index (13.2) Pre Procedure Procedure: Ankle Brachial Index (ABI 09/02/2017: This exam reveals moderately decreased perfusion of the right lower extremity, noted at the  dorsalis pedis artery level (ABI 0.69) and mildly decreased perfusion of the left lower extremity, noted at the dorsalis pedis artery level (ABI 0.85). Moderately abnormal waveforms of the right ankle. Mildly abnormal waveforms of the left ankle. Bilateral posterior tibial arteries were non-compressible to >211mHg. Likely due to patient's controlled DM1.)  Performed: 09/02/2017 11:46 AM Myocardial perfusion imaging, tomographic (SPECT) (including attenuation correction, qualitative or quantitative wall motion, ejection fraction by first pass or gated technique, additional quantification, when performed); multiple studies, Comments: Exercise sestamibi stress test 08/28/2017: 1. The patient performed treadmill exercise using a Bruce protocol, completing 7:30 minutes. The patient completed an estimated workload of 8.95 METS, reaching 85% maximum predicted heart rate. Normal resting blood pressure with exaggerated hypertensive response on exercise. Low normal  exercise capacity for age. Stress symptoms included dyspnea 2. Stress electrocardiogram is positive for ischemia with inferior T wave inversions. 3. The overall quality of the study is excellent. Left ventricular cavity is noted to be normal on the rest and stress studies. Gated SPECT imaging demonstrates hypokinesis of the basal inferior, basal inferolateral, mid inferior and mid inferolateral myocardial wall(s). The left ventricular ejection fraction was calculated or visually estimated to be 42%. SPECT images demonstrate large, predominantly reversible perfusion abnormality of severe intensity in the basal inferior, basal inferolateral, mid inferior and mid inferolateral myocardial wall(s) on the stress images. This suggests small area of infarct with medium sized peri infarct ischemia in LCx/PDA perfusion territory 4. This is a high risk study.  Performed: 08/28/2017 11:48 AM    Assessment & Plan (Peter PageMD; 09/12/2017 6:26 [name] Dyspnea on exertion (R06.09) Story: EKG 08/17/2017: Normal sinus rhythm at rate of 82 bpm, normal axis. Nonspecific T abnormality.  Echocardiogram 09/02/2017: Left ventricle cavity is normal in size. Moderate concentric hypertrophy of the left ventricle. Normal global wall motion. Doppler evidence of grade I (impaired) diastolic dysfunction, normal LAP. Calculated EF 55%. Mild to moderate mitral regurgitation. Inadequate tricuspid regurgitation jet to estimate pulmonary artery pressure. Normal right atrial pressure. Impression: Exercise sestamibi stress test 08/28/2017: 1. The patient performed treadmill exercise using a Bruce protocol, completing 7:30 minutes. The patient completed an estimated workload of 8.95 METS, reaching 85% maximum predicted heart rate. Normal resting blood pressure with exaggerated hypertensive response on exercise. Low normal exercise capacity for age. Stress symptoms included dyspnea 2. Stress electrocardiogram is positive for  ischemia with inferior T wave inversions. 3. The overall quality of the study is excellent. Left ventricular cavity is noted to be normal on the rest and stress studies. Gated SPECT imaging demonstrates hypokinesis of the basal inferior, basal inferolateral, mid inferior and mid inferolateral myocardial wall(s). The left ventricular ejection fraction was calculated or visually estimated to be 42%. SPECT images demonstrate large, predominantly reversible perfusion abnormality of severe intensity in the basal inferior, basal inferolateral, mid inferior and mid inferolateral myocardial wall(s) on the stress images. This suggests small area of infarct with medium sized peri infarct ischemia in LCx/PDA perfusion territory 4. This is a high risk study. Interstitial lung disease (J84.9) Story: Nocturnal oximetry 08/21/2017: Less than 88% SpO2 9.4 minutes, less than 89% 1.2 minutes, lowest SpO2 70%. Oxygen desaturation events 3%. Patient qualifies for nocturnal oxygen supplementation, mildly abnormal nocturnal oximetry.  CT Chest 2015: 1. No aortic dissection or aneurysm. 2. Pulmonary vascular congestion. 3. Mild chronic interstitial lung disease. 4. Small to moderate-sized paraesophageal hiatal hernia.5. Cholelithiasis. Benign essential hypertension (I10) Current Plans METABOLIC PANEL, BASIC (857322 CBC & PLATELETS (AUTO) ((02542 Mixed hyperlipidemia (E78.2)  Current Plans Started Ezetimibe 10MG, 1 (one) Tablet take in the evening after dinner, #30, 30 days starting 09/11/2017, Ref. x2. Erectile dysfunction of organic origin (N52.9) Story: Abdominal aortic duplex 09/02/2017: Diffuse plaque noted in the proximal, mid and distal aorta. The maximum aorta diameter is 2.12 cm (prox). Diffuse plaque observed in the proximal, mid and distal aorta. Peak systolic velocities in the distal aorta are moderately increased to 252.39 cm/s and may suggest hemodynamically significant stenosis of >50%. Mildly abnormal iliac  artery waveform without hemodynamically significant stenosis. Clinical correlation is suggested.  Normal IVC respiratory variation. Uncontrolled type 1 diabetes mellitus with hyperglycemia (E10.65) Stenosis of iliac artery (I77.1) Story: ABI 09/02/2017: This exam reveals moderately decreased perfusion of the right lower extremity, noted at the dorsalis pedis artery level (ABI 0.69) and mildly decreased perfusion of the left lower extremity, noted at the dorsalis pedis artery level (ABI 0.85). Moderately abnormal waveforms of the right ankle. Mildly abnormal waveforms of the left ankle. Bilateral posterior tibial arteries were non-compressible to >275mHg. Likely due to patient's controlled DM1. Laboratory examination (Z01.89) Story: Labs 05/24/2017: Potassium 5.4, serum glucose 152, BUN 16, creatinine 1.11, eGFR 76. CMP otherwise normal. AST, ALT normal. Total cholesterol 227, triglycerides 194, HDL 29, LDL 159. CBC normal.   Recommendations:   Mr. KKrishon Adkisonis a pleasant Caucasian male with worsening shortness breath and dyspnea on exertion in the past 2-3 months. He is also being worked up for IPF and sleep apneaa by Dr. RChase Caller Due to multiple cardiovascular risk factors, due to marked worsening dyspnea, disproportionate to physical exam, [place] His cardiovascular risk factors include uncontrolled type I diabetes, mixed hyperlipidemia, hypertension.  He underwent abdominal aortic duplex which revealed no evidence abdominal aortic aneurysm by distal abdominal aortic velocity had increased suggesting hemodynamically significant stenosis. ABI mildly reduced however calcified vessels are noncompressible. Echocardiogram revealed normal LVEF with mild to moderate amount. Nuclear stress test revealed a large area of inferior and inferolateral small scar but mostly reversible ischemia, high risk stress test. All these tests were done in February 2019. He is now accompanied  by his wife and presents here to discuss the results.  I have recommended proceeding with left heart catheterization and also peripheral arteriogram. Extensive discussion was also held regarding coronary artery disease, especially multivessel disease and need for staged intervention and also need for possible distal abdominal aortic stenting and/or iliac stenting which may also help with both claudication and erectile dysfunction. They're aware of less than 1-2% risk of death, stroke, MI, need for urgent CABG, perforation, but not limited to this including peripheral bypass surgery.  CC Dr. JJani Gravel(PCP; CC Dr. MBrand Males ([facility]).    Signed by Peter Page MD (09/12/2017 6:28 [name]

## 2017-09-22 ENCOUNTER — Ambulatory Visit (HOSPITAL_COMMUNITY)
Admission: RE | Admit: 2017-09-22 | Discharge: 2017-09-22 | Disposition: A | Discharge status: Home / Self Care | Payer: 59 | Source: Ambulatory Visit | Attending: Cardiology | Admitting: Cardiology

## 2017-09-22 ENCOUNTER — Encounter (HOSPITAL_COMMUNITY)
Admission: RE | Disposition: A | Discharge status: Home / Self Care | Payer: Self-pay | Source: Ambulatory Visit | Attending: Cardiology

## 2017-09-22 DIAGNOSIS — Z9981 Dependence on supplemental oxygen: Secondary | ICD-10-CM | POA: Diagnosis not present

## 2017-09-22 DIAGNOSIS — E782 Mixed hyperlipidemia: Secondary | ICD-10-CM | POA: Diagnosis not present

## 2017-09-22 DIAGNOSIS — K219 Gastro-esophageal reflux disease without esophagitis: Secondary | ICD-10-CM | POA: Diagnosis not present

## 2017-09-22 DIAGNOSIS — N529 Male erectile dysfunction, unspecified: Secondary | ICD-10-CM | POA: Diagnosis not present

## 2017-09-22 DIAGNOSIS — E1065 Type 1 diabetes mellitus with hyperglycemia: Secondary | ICD-10-CM | POA: Diagnosis not present

## 2017-09-22 DIAGNOSIS — J849 Interstitial pulmonary disease, unspecified: Secondary | ICD-10-CM | POA: Diagnosis not present

## 2017-09-22 DIAGNOSIS — I2510 Atherosclerotic heart disease of native coronary artery without angina pectoris: Secondary | ICD-10-CM | POA: Insufficient documentation

## 2017-09-22 DIAGNOSIS — I2582 Chronic total occlusion of coronary artery: Secondary | ICD-10-CM | POA: Insufficient documentation

## 2017-09-22 DIAGNOSIS — I10 Essential (primary) hypertension: Secondary | ICD-10-CM | POA: Diagnosis not present

## 2017-09-22 DIAGNOSIS — E1051 Type 1 diabetes mellitus with diabetic peripheral angiopathy without gangrene: Secondary | ICD-10-CM | POA: Insufficient documentation

## 2017-09-22 DIAGNOSIS — I7409 Other arterial embolism and thrombosis of abdominal aorta: Secondary | ICD-10-CM | POA: Diagnosis not present

## 2017-09-22 DIAGNOSIS — R9439 Abnormal result of other cardiovascular function study: Secondary | ICD-10-CM | POA: Diagnosis not present

## 2017-09-22 DIAGNOSIS — Z7982 Long term (current) use of aspirin: Secondary | ICD-10-CM | POA: Insufficient documentation

## 2017-09-22 DIAGNOSIS — I700 Atherosclerosis of aorta: Secondary | ICD-10-CM | POA: Insufficient documentation

## 2017-09-22 DIAGNOSIS — E10319 Type 1 diabetes mellitus with unspecified diabetic retinopathy without macular edema: Secondary | ICD-10-CM | POA: Insufficient documentation

## 2017-09-22 DIAGNOSIS — I739 Peripheral vascular disease, unspecified: Secondary | ICD-10-CM | POA: Diagnosis present

## 2017-09-22 HISTORY — PX: LEFT HEART CATH AND CORONARY ANGIOGRAPHY: CATH118249

## 2017-09-22 HISTORY — PX: LOWER EXTREMITY ANGIOGRAPHY: CATH118251

## 2017-09-22 LAB — GLUCOSE, CAPILLARY
GLUCOSE-CAPILLARY: 95 mg/dL (ref 65–99)
Glucose-Capillary: 150 mg/dL — ABNORMAL HIGH (ref 65–99)

## 2017-09-22 SURGERY — LOWER EXTREMITY ANGIOGRAPHY
Anesthesia: LOCAL

## 2017-09-22 MED ORDER — SODIUM CHLORIDE 0.9% FLUSH: 3.0000 mL | Freq: Two times a day (BID) | INTRAVENOUS | Status: DC

## 2017-09-22 MED ORDER — HEPARIN (PORCINE) IN NACL 2-0.9 UNIT/ML-% IJ SOLN
INTRAMUSCULAR | Status: AC
Start: 2017-09-22 — End: 2017-09-22
  Filled 2017-09-22: qty 1000

## 2017-09-22 MED ORDER — SODIUM CHLORIDE 0.9 % IV SOLN: 250.0000 mL | INTRAVENOUS | Status: DC | PRN

## 2017-09-22 MED ORDER — LIDOCAINE HCL (PF) 1 % IJ SOLN
INTRAMUSCULAR | Status: DC | PRN
  Administered 2017-09-22: 14 mL

## 2017-09-22 MED ORDER — SODIUM CHLORIDE 0.9 % WEIGHT BASED INFUSION: 3.0000 mL/kg/h | INTRAVENOUS | Status: AC

## 2017-09-22 MED ORDER — SODIUM CHLORIDE 0.9 % WEIGHT BASED INFUSION: 1.0000 mL/kg/h | INTRAVENOUS | Status: DC

## 2017-09-22 MED ORDER — SODIUM CHLORIDE 0.9% FLUSH: 3.0000 mL | INTRAVENOUS | Status: DC | PRN

## 2017-09-22 MED ORDER — SODIUM CHLORIDE 0.9 % WEIGHT BASED INFUSION
3.0000 mL/kg/h | INTRAVENOUS | Status: DC
  Administered 2017-09-22: 3 mL/kg/h via INTRAVENOUS

## 2017-09-22 MED ORDER — MIDAZOLAM HCL 2 MG/2ML IJ SOLN
INTRAMUSCULAR | Status: AC
  Filled 2017-09-22: qty 2

## 2017-09-22 MED ORDER — ONDANSETRON HCL 4 MG/2ML IJ SOLN: 4.0000 mg | Freq: Four times a day (QID) | INTRAMUSCULAR | Status: DC | PRN

## 2017-09-22 MED ORDER — FENTANYL CITRATE (PF) 100 MCG/2ML IJ SOLN
INTRAMUSCULAR | Status: DC | PRN
  Administered 2017-09-22 (×2): 50 ug via INTRAVENOUS

## 2017-09-22 MED ORDER — LIDOCAINE HCL 1 % IJ SOLN
INTRAMUSCULAR | Status: AC
  Filled 2017-09-22: qty 20

## 2017-09-22 MED ORDER — SODIUM CHLORIDE 0.9 % IV SOLN: INTRAVENOUS | Status: DC

## 2017-09-22 MED ORDER — MIDAZOLAM HCL 2 MG/2ML IJ SOLN
INTRAMUSCULAR | Status: DC | PRN
  Administered 2017-09-22: 2 mg via INTRAVENOUS
  Administered 2017-09-22: 1 mg via INTRAVENOUS

## 2017-09-22 MED ORDER — ASPIRIN 81 MG PO CHEW: 81.0000 mg | CHEWABLE_TABLET | ORAL | Status: DC

## 2017-09-22 MED ORDER — FENTANYL CITRATE (PF) 100 MCG/2ML IJ SOLN
INTRAMUSCULAR | Status: AC
  Filled 2017-09-22: qty 2

## 2017-09-22 MED ORDER — IODIXANOL 320 MG/ML IV SOLN
INTRAVENOUS | Status: DC | PRN
Start: 2017-09-22 — End: 2017-09-22
  Administered 2017-09-22: 190 mL via INTRAVENOUS

## 2017-09-22 MED ORDER — HEPARIN (PORCINE) IN NACL 2-0.9 UNIT/ML-% IJ SOLN
INTRAMUSCULAR | Status: AC | PRN
  Administered 2017-09-22 (×2): 500 mL

## 2017-09-22 MED ORDER — ACETAMINOPHEN 325 MG PO TABS: 650.0000 mg | ORAL_TABLET | ORAL | Status: DC | PRN

## 2017-09-22 SURGICAL SUPPLY — 19 items
CATH INFINITI 5 FR 3DRC (CATHETERS) ×1 IMPLANT
CATH INFINITI 5 FR AR1 MOD (CATHETERS) ×1 IMPLANT
CATH INFINITI 5FR JL4 (CATHETERS) ×1 IMPLANT
CATH INFINITI 5FR MPB2 (CATHETERS) ×1 IMPLANT
CATH INFINITI JR4 5F (CATHETERS) ×1 IMPLANT
CATH OMNI FLUSH 5F 65CM (CATHETERS) ×1 IMPLANT
CATH STRAIGHT 5FR 65CM (CATHETERS) ×1 IMPLANT
COVER PRB 48X5XTLSCP FOLD TPE (BAG) IMPLANT
COVER PROBE 5X48 (BAG) ×2
DEVICE CLOSURE MYNXGRIP 5F (Vascular Products) ×1 IMPLANT
KIT MICROPUNCTURE NIT STIFF (SHEATH) ×1 IMPLANT
KIT PV (KITS) ×2 IMPLANT
SHEATH PINNACLE 5F 10CM (SHEATH) ×1 IMPLANT
STOPCOCK MORSE 400PSI 3WAY (MISCELLANEOUS) ×1 IMPLANT
SYRINGE MEDRAD AVANTA MACH 7 (SYRINGE) ×1 IMPLANT
TRANSDUCER W/STOPCOCK (MISCELLANEOUS) ×2 IMPLANT
TRAY PV CATH (CUSTOM PROCEDURE TRAY) ×2 IMPLANT
TUBING CIL FLEX 10 FLL-RA (TUBING) ×3 IMPLANT
WIRE HITORQ VERSACORE ST 145CM (WIRE) ×1 IMPLANT

## 2017-09-22 NOTE — Interval H&P Note (Signed)
History and Physical Interval Note:  09/22/2017 12:50 PM  Peter Blake  has presented today for surgery, with the diagnosis of positive stress test, stenosis of right lower iliac artery  The various methods of treatment have been discussed with the patient and family. After consideration of risks, benefits and other options for treatment, the patient has consented to  Procedure(s): LOWER EXTREMITY ANGIOGRAPHY (N/A) LEFT HEART CATH AND CORONARY ANGIOGRAPHY (N/A) as a surgical intervention .  The patient's history has been reviewed, patient examined, no change in status, stable for surgery.  I have reviewed the patient's chart and labs.  Questions were answered to the patient's satisfaction.    2016/2017 Appropriate Use Criteria for Coronary Revascularization Clinical Presentation: Diabetes Mellitus? Symptom Status? S/P CABG? Antianginal Therapy (# of long-acting drugs)? Results of Non-invasive testing? FFR/iFR results in all diseased vessels? Patient undergoing renal transplant? Patient undergoing percutaneous valve procedure (TAVR, MitraClip, Others)? Symptom Status:  Ischemic Symptoms  Non-invasive Testing:  High risk  If no or indeterminate stress test, FFR/iFR results in all diseased vessels:  N/A  Diabetes Mellitus:  Yes  S/P CABG:  No  Antianginal therapy (number of long-acting drugs):  1  Patient undergoing renal transplant:  No  Patient undergoing percutaneous valve procedure:  No    newline 1 Vessel Disease PCI CABG  No proximal LAD involvement, No proximal left dominant LCX involvement M (6); Indication 2 M (4); Indication 2   Proximal left dominant LCX involvement A (7); Indication 5 A (7); Indication 5   Proximal LAD involvement A (7); Indication 5 A (7); Indication 5   newline 2 Vessel Disease  No proximal LAD involvement A (7); Indication 8 M (6); Indication 8   Proximal LAD involvement A (7); Indication 14 A (8); Indication 14   newline 3 Vessel Disease  Low  disease complexity (e.g., focal stenoses, SYNTAX <=22) A (7); Indication 19 A (8); Indication 19   Intermediate or high disease complexity (e.g., SYNTAX >=23) M (5); Indication 23 A (8); Indication 23   newline Left Main Disease  Isolated LMCA disease: ostial or midshaft A (7); Indication 24 A (9); Indication 24   Isolated LMCA disease: bifurcation involvement M (5); Indication 25 A (9); Indication 25   LMCA ostial or midshaft, concurrent low disease burden multivessel disease (e.g., 1-2 additional focal stenoses, SYNTAX <=22) A (7); Indication 26 A (9); Indication 26   LMCA ostial or midshaft, concurrent intermediate or high disease burden multivessel disease (e.g., 1-2 additional bifurcation stenoses, long stenoses, SYNTAX >=23) M (4); Indication 27 A (9); Indication 27   LMCA bifurcation involvement, concurrent low disease burden multivessel disease (e.g., 1-2 additional focal stenoses, SYNTAX <=22) M (5); Indication 28 A (9); Indication 28   LMCA bifurcation involvement, concurrent intermediate or high disease burden multivessel disease (e.g., 1-2 additional bifurcation stenoses, long stenoses, SYNTAX >=23) R (3); Indication 29 A (9); Indication [place]

## 2017-09-22 NOTE — Research (Signed)
CADFEM Informed Consent   Subject Name: Peter Blake [facility]  Subject met inclusion and exclusion criteria.  The informed consent form, study requirements and expectations were reviewed with the subject and questions and concerns were addressed prior to the signing of the consent form.  The subject verbalized understanding of the trail requirements.  The subject agreed to participate in the CADFEM trial and signed the informed consent.  The informed consent was obtained prior to performance of any protocol-specific procedures for the subject.  A copy of the signed informed consent was given to the subject and a copy was placed in the subject's medical record.  [name] 09/22/2017, 8:21AM

## 2017-09-22 NOTE — Discharge Instructions (Signed)

## 2017-09-23 ENCOUNTER — Encounter (HOSPITAL_COMMUNITY): Payer: Self-pay | Admitting: Cardiology

## 2017-09-26 NOTE — Progress Notes (Signed)
Patient not a candidate for CABG, PL branch unbypassable and severely diffusely diseased. D/W patient and wife after procedure extensively and answered all questions regarding PCI, dual antiplatelet therapy and complications. He will be scheduled for elective PCI Cx and LAD and may need FFR to distal LAD.  Peter Blake

## 2017-09-27 DIAGNOSIS — I25119 Atherosclerotic heart disease of native coronary artery with unspecified angina pectoris: Secondary | ICD-10-CM

## 2017-09-27 NOTE — H&P (Signed)
Peter Blake 09/11/2017 3:30 Blake Location: [facility] Patient #: [phone] DOB: 1966-05-30 Married / Language: Peter Blake / Race: White Male   History of Present Illness Peter Blake; 09/12/2017 6:27 [name] Patient words: Last OV 08/17/2017; f/u nuc, echo, abi's, abd duplex.  The patient is a 52 year old male who presents for a Follow-up for Erectile dysfunction.  Additional reasons for visit:  Follow-up for Shortness of breath is described as the following: Mr. Peter Blake is a pleasant Caucasian male with worsening shortness breath and dyspnea on exertion in the past 2-3 months. He is also being worked up for IPF and sleep apneaa by Dr. Chase Caller. Due to multiple cardiovascular risk factors, due to marked worsening dyspnea, disproportionate to physical exam, he was referred to [place]. His cardiovascular risk factors include uncontrolled type I diabetes, mixed hyperlipidemia, hypertension.  Both of them have been extremely concerned about erectile dysfunction. He has tried medications and also injectables and has been evaluated by urology but has not been successful. He also complains of weakness in his legs, states that most he can walk 1/2 mile and feels extremely weak and fatigued in his legs. He had not given his history previously.  He underwent abdominal aortic duplex which revealed no evidence abdominal aortic aneurysm by distal abdominal aortic velocity had increased suggesting hemodynamically significant stenosis. ABI mildly reduced however calcified vessels are noncompressible. Echocardiogram revealed normal LVEF with mild to moderate amount. Nuclear stress test revealed a large area of inferior and inferolateral small scar but mostly reversible ischemia, high risk stress test. All tests were done in February 2019. He is now accompanied by his wife and presents here to discuss the results.   Problem List/Past Medical Peter Blake; 09/11/2017 3:29 [name] Acid  reflux (K21.9)  Ventral hernia (K43.9)  Diabetic retinopathy (E11.319)  Laboratory examination (Z01.89)  Labs 05/24/2017: Potassium 5.4, serum glucose 152, BUN 16, creatinine 1.11, eGFR 76. CMP otherwise normal. AST, ALT normal. Total cholesterol 227, triglycerides 194, HDL 29, LDL 159. CBC normal. Interstitial lung disease (J84.9)  Nocturnal oximetry 08/21/2017: Less than 88% SpO2 9.4 minutes, less than 89% 1.2 minutes, lowest SpO2 70%. Oxygen desaturation events 3%. Patient qualifies for nocturnal oxygen supplementation, mildly abnormal nocturnal oximetry. CT Chest 2015: 1. No aortic dissection or aneurysm. 2. Pulmonary vascular congestion. 3. Mild chronic interstitial lung disease. 4. Small to moderate-sized paraesophageal hiatal hernia.5. Cholelithiasis. Dyspnea on exertion (R06.09)  EKG 08/17/2017: Normal sinus rhythm at rate of 82 bpm, normal axis. Nonspecific T abnormality. Echocardiogram 09/02/2017: Left ventricle cavity is normal in size. Moderate concentric hypertrophy of the left ventricle. Normal global wall motion. Doppler evidence of grade I (impaired) diastolic dysfunction, normal LAP. Calculated EF 55%. Mild to moderate mitral regurgitation. Inadequate tricuspid regurgitation jet to estimate pulmonary artery pressure. Normal right atrial pressure. Benign essential hypertension (I10)  Mixed hyperlipidemia (E78.2)  Uncontrolled type 1 diabetes mellitus with hyperglycemia (E10.65)  Erectile dysfunction of organic origin (N52.9)  Abdominal aortic duplex 09/02/2017: Diffuse plaque noted in the proximal, mid and distal aorta. The maximum aorta diameter is 2.12 cm (prox). Diffuse plaque observed in the proximal, mid and distal aorta. Peak systolic velocities in the distal aorta are moderately increased to 252.39 cm/s and may suggest hemodynamically significant stenosis of >50%. Mildly abnormal iliac artery waveform without hemodynamically significant stenosis. Clinical correlation is  suggested. Normal IVC respiratory variation. Stenosis of iliac artery (I77.1)  ABI 09/02/2017: This exam reveals moderately decreased perfusion of the right lower extremity, noted at the dorsalis  pedis artery level (ABI 0.69) and mildly decreased perfusion of the left lower extremity, noted at the dorsalis pedis artery level (ABI 0.85). Moderately abnormal waveforms of the right ankle. Mildly abnormal waveforms of the left ankle. Bilateral posterior tibial arteries were non-compressible to >212mHg. Likely due to patient's controlled DM1.  Allergies (Peter Blake; 22019/03/133:29 [name] MetFORMIN HCl *ANTIDIABETICS*  Nausea. Atorvastatin Calcium *ANTIHYPERLIPIDEMICS*  Severe myalgia Crestor *ANTIHYPERLIPIDEMICS*  Severe myalgia Lisinopril *ANTIHYPERTENSIVES*  Cough.  Family History (Peter [name] Family history unknown - Adopted   Social History (Peter Blake [id number] [name] Current tobacco use  Never smoker. Non Drinker/No Alcohol Use  Marital status  Married. Living Situation  Lives with spouse. Number of Children  2.  Past Surgical History (Peter Blake 203/13/20193:29 [name] Cholecystectomy [2014]:  Medication History (Peter Peter Blake 2March 13, 20193:33 [name] Aspirin (81MG Tablet Chewable, 1 (one) Tablet Oral daily, Taken starting 08/17/2017) Active. Livalo (1MG Tablet, 1 Oral three times weekly, Taken starting 08/17/2017) Active. AmLODIPine Besylate (10MG Tablet, 1 Oral daily) Active. Losartan Potassium (100MG Tablet, 1 Oral daily) Active. Basaglar KwikPen (100UNIT/ML Soln Pen-inj, 60u Subcutaneous at bedtime) Active. HumaLOG (100UNIT/ML Solution, 30u sliding scale Subcutaneous three times daily) Active. HydroCHLOROthiazide (12.5MG Tablet, 1 Oral every morning) Active. Medications Reconciled (verbally)  Diagnostic Studies History (Peter Blake 2March 13, 20193:28 [name] Nuclear stress test  08/28/2017: 1. The patient performed treadmill  exercise using a Bruce protocol, completing 7:30 minutes. The patient completed an estimated workload of 8.95 METS, reaching 85% maximum predicted heart rate. Normal resting blood pressure with exaggerated hypertensive response on exercise. Low normal exercise capacity for age. Stress symptoms included dyspnea 2. Stress electrocardiogram is positive for ischemia with inferior T wave inversions. 3. The overall quality of the study is excellent. Left ventricular cavity is noted to be normal on the rest and stress studies. Gated SPECT imaging demonstrates hypokinesis of the basal inferior, basal inferolateral, mid inferior and mid inferolateral myocardial wall(s). The left ventricular ejection fraction was calculated or visually estimated to be 42%. SPECT images demonstrate large, predominantly reversible perfusion abnormality of severe intensity in the basal inferior, basal inferolateral, mid inferior and mid inferolateral myocardial wall(s) on the stress images. This suggests small area of infarct with medium sized peri infarct ischemia in LCx/PDA perfusion territory 4. This is a high risk study. Abdominal Ultrasound  09/02/2017: Diffuse plaque noted in the proximal, mid and distal aorta. The maximum aorta diameter is 2.12 cm (prox). Diffuse plaque observed in the proximal, mid and distal aorta. Peak systolic velocities in the distal aorta are moderately increased to 252.39 cm/s and may suggest hemodynamically significant stenosis of >50%. Mildly abnormal iliac artery waveform without hemodynamically significant stenosis. Clinical correlation is suggested.  Normal IVC respiratory variation. [name]  09/02/2017: This exam reveals moderately decreased perfusion of the right lower extremity, noted at the dorsalis pedis artery level (ABI 0.69) and mildly decreased perfusion of the left lower extremity, noted at the dorsalis pedis artery level (ABI 0.85). Moderately abnormal waveforms of the right ankle. Mildly  abnormal waveforms of the left ankle. Bilateral posterior tibial arteries were non-compressible to >2049mg. Likely due to patient's controlled DM1. Echocardiogram  09/02/2017: Left ventricle cavity is normal in size. Moderate concentric hypertrophy of the left ventricle. Normal global wall motion. Doppler evidence of grade I (impaired) diastolic dysfunction, normal LAP. Calculated EF 55%. Mild to moderate mitral regurgitation. Inadequate tricuspid regurgitation jet to estimate pulmonary artery pressure. Normal right atrial pressure.    Review of Systems (Peter Page  Blake; 09/12/2017 6:26 [name] General Present- Tiredness. Not Present- Appetite Loss and Weight Gain. Respiratory Present- Difficulty Breathing on Exertion and Snoring (severe). Not Present- Chronic Cough, Sputum Production, Wakes up from Sleep Wheezing or Short of Breath and Wheezing. Cardiovascular Present- Claudications. Not Present- Chest Pain and Night Cramps. Gastrointestinal Not Present- Black, Tarry Stool and Difficulty Swallowing. Male Genitourinary Present- Impotence. Musculoskeletal Not Present- Decreased Range of Motion and Muscle Atrophy. Neurological Not Present- Attention Deficit. Psychiatric Not Present- Personality Changes and Suicidal Ideation. Endocrine Not Present- Cold Intolerance and Heat Intolerance. Hematology Not Present- Abnormal Bleeding. All other systems negative  Vitals Peter Blake; 09/11/2017 3:35 [name] 09/11/2017 3:35 Blake Weight: 200.44 lb Height: 68in Body Surface Area: 2.05 m Body Mass Index: 30.48 kg/m  BP: 118/76 (Sitting, Right Arm, Standard)    09/11/2017 3:30 Blake Weight: 200.44 lb Height: 68in Body Surface Area: 2.05 m Body Mass Index: 30.48 kg/m  Pulse: 80 (Regular)  P.OX: 98% (Room air) BP: 110/82 (Sitting, Left Arm, Standard)       Physical Exam Peter Page, Blake; 09/12/2017 6:26 [name] General Mental Status-Alert. General  Appearance-Cooperative and Appears stated age. Build & Nutrition-Well built and Mildly obese.  Head and Neck Thyroid Gland Characteristics - normal size and consistency and no palpable nodules.  Chest and Lung Exam Chest and lung exam reveals -quiet, even and easy respiratory effort with no use of accessory muscles and non-tender. Auscultation Adventitious sounds - End inspiratory coarse crackles - Both Lung Fields(at the bases).  Cardiovascular Cardiovascular examination reveals -normal heart sounds, regular rate and rhythm with no murmurs, carotid auscultation reveals no bruits and abdominal aorta auscultation reveals no bruits and no prominent pulsation.  Abdomen Palpation/Percussion Normal exam - Non Tender and No hepatosplenomegaly.  Peripheral Vascular Lower Extremity Inspection - Bilateral - Shiny atrophic skin, No Varicose veins. Palpation - Temperature - Bilateral - Normal. Edema - Bilateral - Trace edema. Femoral pulse - Bilateral - 1+. Popliteal pulse - Bilateral - 1+. Dorsalis pedis pulse - Bilateral - Feeble. Posterior tibial pulse - Bilateral - Normal. Carotid arteries - Bilateral-No Carotid bruit.  Neurologic Neurologic evaluation reveals -alert and oriented x 3 with no impairment of recent or remote memory. Motor-Grossly intact without any focal deficits.  Musculoskeletal Global Assessment Left Lower Extremity - no deformities, masses or tenderness, no known fractures. Right Lower Extremity - no deformities, masses or tenderness, no known fractures.   Results Peter Blake; 09/12/2017 6:26 [name] Procedures  Name Value Date Echocardiography, transthoracic, real-time with image documentation (2D), includes M-mode recording, when performed, complete, with spectral Doppler echocardiography, and with color flow Doppler echocardiography (92010) Comments: Echocardiogram 09/02/2017: Left ventricle cavity is normal in size. Moderate concentric  hypertrophy of the left ventricle. Normal global wall motion. Doppler evidence of grade I (impaired) diastolic dysfunction, normal LAP. Calculated EF 55%. Mild to moderate mitral regurgitation. Inadequate tricuspid regurgitation jet to estimate pulmonary artery pressure. Normal right atrial pressure.  Performed: 09/02/2017 11:43 AM Duplex scan of aorta, inferior vena cava, iliac vasculature, or bypass grafts; complete study (07121) Comments: Abdominal aortic duplex 09/02/2017: Diffuse plaque noted in the proximal, mid and distal aorta. The maximum aorta diameter is 2.12 cm (prox). Diffuse plaque observed in the proximal, mid and distal aorta. Peak systolic velocities in the distal aorta are moderately increased to 252.39 cm/s and may suggest hemodynamically significant stenosis of >50%. Mildly abnormal iliac artery waveform without hemodynamically significant stenosis. Clinical correlation is suggested.  Normal IVC respiratory variation.  Performed: 09/02/2017 11:44 AM  CV - Ankle Brachial Index (13.2) Pre Procedure Procedure: Ankle Brachial Index (ABI 09/02/2017: This exam reveals moderately decreased perfusion of the right lower extremity, noted at the dorsalis pedis artery level (ABI 0.69) and mildly decreased perfusion of the left lower extremity, noted at the dorsalis pedis artery level (ABI 0.85). Moderately abnormal waveforms of the right ankle. Mildly abnormal waveforms of the left ankle. Bilateral posterior tibial arteries were non-compressible to >250mHg. Likely due to patient's controlled DM1.)  Performed: 09/02/2017 11:46 AM Myocardial perfusion imaging, tomographic (SPECT) (including attenuation correction, qualitative or quantitative wall motion, ejection fraction by first pass or gated technique, additional quantification, when performed); multiple studies, Comments: Exercise sestamibi stress test 08/28/2017: 1. The patient performed treadmill exercise using a Bruce protocol,  completing 7:30 minutes. The patient completed an estimated workload of 8.95 METS, reaching 85% maximum predicted heart rate. Normal resting blood pressure with exaggerated hypertensive response on exercise. Low normal exercise capacity for age. Stress symptoms included dyspnea 2. Stress electrocardiogram is positive for ischemia with inferior T wave inversions. 3. The overall quality of the study is excellent. Left ventricular cavity is noted to be normal on the rest and stress studies. Gated SPECT imaging demonstrates hypokinesis of the basal inferior, basal inferolateral, mid inferior and mid inferolateral myocardial wall(s). The left ventricular ejection fraction was calculated or visually estimated to be 42%. SPECT images demonstrate large, predominantly reversible perfusion abnormality of severe intensity in the basal inferior, basal inferolateral, mid inferior and mid inferolateral myocardial wall(s) on the stress images. This suggests small area of infarct with medium sized peri infarct ischemia in LCx/PDA perfusion territory 4. This is a high risk study.  Performed: 08/28/2017 11:48 AM    Assessment & Plan (Peter PageMD; 09/12/2017 6:26 [name] Dyspnea on exertion (R06.09) Story: EKG 08/17/2017: Normal sinus rhythm at rate of 82 bpm, normal axis. Nonspecific T abnormality.  Echocardiogram 09/02/2017: Left ventricle cavity is normal in size. Moderate concentric hypertrophy of the left ventricle. Normal global wall motion. Doppler evidence of grade I (impaired) diastolic dysfunction, normal LAP. Calculated EF 55%. Mild to moderate mitral regurgitation. Inadequate tricuspid regurgitation jet to estimate pulmonary artery pressure. Normal right atrial pressure. Impression: Exercise sestamibi stress test 08/28/2017: 1. The patient performed treadmill exercise using a Bruce protocol, completing 7:30 minutes. The patient completed an estimated workload of 8.95 METS, reaching 85% maximum  predicted heart rate. Normal resting blood pressure with exaggerated hypertensive response on exercise. Low normal exercise capacity for age. Stress symptoms included dyspnea 2. Stress electrocardiogram is positive for ischemia with inferior T wave inversions. 3. The overall quality of the study is excellent. Left ventricular cavity is noted to be normal on the rest and stress studies. Gated SPECT imaging demonstrates hypokinesis of the basal inferior, basal inferolateral, mid inferior and mid inferolateral myocardial wall(s). The left ventricular ejection fraction was calculated or visually estimated to be 42%. SPECT images demonstrate large, predominantly reversible perfusion abnormality of severe intensity in the basal inferior, basal inferolateral, mid inferior and mid inferolateral myocardial wall(s) on the stress images. This suggests small area of infarct with medium sized peri infarct ischemia in LCx/PDA perfusion territory 4. This is a high risk study. Interstitial lung disease (J84.9) Story: Nocturnal oximetry 08/21/2017: Less than 88% SpO2 9.4 minutes, less than 89% 1.2 minutes, lowest SpO2 70%. Oxygen desaturation events 3%. Patient qualifies for nocturnal oxygen supplementation, mildly abnormal nocturnal oximetry.  CT Chest 2015: 1. No aortic dissection or aneurysm. 2. Pulmonary vascular congestion. 3. Mild chronic interstitial  lung disease. 4. Small to moderate-sized paraesophageal hiatal hernia.5. Cholelithiasis. Benign essential hypertension (I10) Current Plans METABOLIC PANEL, BASIC (40814) CBC & PLATELETS (AUTO) (48185) Mixed hyperlipidemia (E78.2) Current Plans Started Ezetimibe 10MG, 1 (one) Tablet take in the evening after dinner, #30, 30 days starting 09/11/2017, Ref. x2. Erectile dysfunction of organic origin (N52.9) Story: Abdominal aortic duplex 09/02/2017: Diffuse plaque noted in the proximal, mid and distal aorta. The maximum aorta diameter is 2.12 cm (prox). Diffuse  plaque observed in the proximal, mid and distal aorta. Peak systolic velocities in the distal aorta are moderately increased to 252.39 cm/s and may suggest hemodynamically significant stenosis of >50%. Mildly abnormal iliac artery waveform without hemodynamically significant stenosis. Clinical correlation is suggested.  Normal IVC respiratory variation. Uncontrolled type 1 diabetes mellitus with hyperglycemia (E10.65) Stenosis of iliac artery (I77.1) Story: ABI 09/02/2017: This exam reveals moderately decreased perfusion of the right lower extremity, noted at the dorsalis pedis artery level (ABI 0.69) and mildly decreased perfusion of the left lower extremity, noted at the dorsalis pedis artery level (ABI 0.85). Moderately abnormal waveforms of the right ankle. Mildly abnormal waveforms of the left ankle. Bilateral posterior tibial arteries were non-compressible to >254mHg. Likely due to patient's controlled DM1. Laboratory examination (Z01.89) Story: Labs 05/24/2017: Potassium 5.4, serum glucose 152, BUN 16, creatinine 1.11, eGFR 76. CMP otherwise normal. AST, ALT normal. Total cholesterol 227, triglycerides 194, HDL 29, LDL 159. CBC normal. Current Plans PT (PROTHROMBIN TIME) (863149 Note:-  Recommendations:   Mr. KLamine Latonis a pleasant Caucasian male with worsening shortness breath and dyspnea on exertion in the past 2-3 months. He is also being worked up for IPF and sleep apneaa by Dr. RChase Caller Due to multiple cardiovascular risk factors, due to marked worsening dyspnea, disproportionate to physical exam, [place] His cardiovascular risk factors include uncontrolled type I diabetes, mixed hyperlipidemia, hypertension.  He underwent abdominal aortic duplex which revealed no evidence abdominal aortic aneurysm by distal abdominal aortic velocity had increased suggesting hemodynamically significant stenosis. ABI mildly reduced however calcified vessels are  noncompressible. Echocardiogram revealed normal LVEF with mild to moderate amount. Nuclear stress test revealed a large area of inferior and inferolateral small scar but mostly reversible ischemia, high risk stress test. All these tests were done in February 2019. He is now accompanied by his wife and presents here to discuss the results.  I have recommended proceeding with left heart catheterization and also peripheral arteriogram. Extensive discussion was also held regarding coronary artery disease, especially multivessel disease and need for staged intervention and also need for possible distal abdominal aortic stenting and/or iliac stenting which may also help with both claudication and erectile dysfunction. They're aware of less than 1-2% risk of death, stroke, MI, need for urgent CABG, perforation, but not limited to this including peripheral bypass surgery.  CC Dr. JJani Blake(PCP; CC Dr. MBrand Blake (Pul Med).    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 Mid 100% occlusion, diffuse disease with grade 1 collaterals from LAD septal perforators  30% distal abdominal aorta stenosis with no gradient No signficant PAD  After discussing with other interventional colleagues and patient, we decided to proceed with percutaneous intervention to mid LAD and anamolous LCx  MNigel Mormon Blake PTerre [facility] HospitalCardiovascular. [facility] Pager: [phone]: [phone] no answer Cell [phone]

## 2017-09-29 ENCOUNTER — Ambulatory Visit (HOSPITAL_COMMUNITY)
Admission: RE | Admit: 2017-09-29 | Discharge: 2017-09-30 | Disposition: A | Discharge status: Home / Self Care | Payer: 59 | Source: Ambulatory Visit | Attending: Cardiology | Admitting: Cardiology

## 2017-09-29 ENCOUNTER — Encounter (HOSPITAL_COMMUNITY)
Admission: RE | Disposition: A | Discharge status: Home / Self Care | Payer: Self-pay | Source: Ambulatory Visit | Attending: Cardiology

## 2017-09-29 DIAGNOSIS — Z9861 Coronary angioplasty status: Secondary | ICD-10-CM

## 2017-09-29 DIAGNOSIS — R0609 Other forms of dyspnea: Secondary | ICD-10-CM | POA: Diagnosis present

## 2017-09-29 DIAGNOSIS — Z794 Long term (current) use of insulin: Secondary | ICD-10-CM | POA: Diagnosis not present

## 2017-09-29 DIAGNOSIS — I2582 Chronic total occlusion of coronary artery: Secondary | ICD-10-CM | POA: Insufficient documentation

## 2017-09-29 DIAGNOSIS — I2510 Atherosclerotic heart disease of native coronary artery without angina pectoris: Secondary | ICD-10-CM | POA: Diagnosis not present

## 2017-09-29 DIAGNOSIS — E10319 Type 1 diabetes mellitus with unspecified diabetic retinopathy without macular edema: Secondary | ICD-10-CM | POA: Diagnosis not present

## 2017-09-29 DIAGNOSIS — Z888 Allergy status to other drugs, medicaments and biological substances status: Secondary | ICD-10-CM | POA: Insufficient documentation

## 2017-09-29 DIAGNOSIS — E782 Mixed hyperlipidemia: Secondary | ICD-10-CM | POA: Insufficient documentation

## 2017-09-29 DIAGNOSIS — I25119 Atherosclerotic heart disease of native coronary artery with unspecified angina pectoris: Secondary | ICD-10-CM | POA: Insufficient documentation

## 2017-09-29 DIAGNOSIS — Z955 Presence of coronary angioplasty implant and graft: Secondary | ICD-10-CM

## 2017-09-29 DIAGNOSIS — J849 Interstitial pulmonary disease, unspecified: Secondary | ICD-10-CM | POA: Insufficient documentation

## 2017-09-29 DIAGNOSIS — Z79899 Other long term (current) drug therapy: Secondary | ICD-10-CM | POA: Insufficient documentation

## 2017-09-29 DIAGNOSIS — Z7982 Long term (current) use of aspirin: Secondary | ICD-10-CM | POA: Insufficient documentation

## 2017-09-29 DIAGNOSIS — E1065 Type 1 diabetes mellitus with hyperglycemia: Secondary | ICD-10-CM | POA: Diagnosis not present

## 2017-09-29 DIAGNOSIS — I10 Essential (primary) hypertension: Secondary | ICD-10-CM | POA: Insufficient documentation

## 2017-09-29 HISTORY — DX: Atherosclerotic heart disease of native coronary artery without angina pectoris: I25.10

## 2017-09-29 HISTORY — PX: CORONARY STENT INTERVENTION: CATH118234

## 2017-09-29 HISTORY — DX: Peripheral vascular disease, unspecified: I73.9

## 2017-09-29 LAB — BASIC METABOLIC PANEL
ANION GAP: 8 (ref 5–15)
BUN: 19 mg/dL (ref 6–20)
CO2: 27 mmol/L (ref 22–32)
Calcium: 9.4 mg/dL (ref 8.9–10.3)
Chloride: 102 mmol/L (ref 101–111)
Creatinine, Ser: 1.11 mg/dL (ref 0.61–1.24)
GFR calc Af Amer: >60 mL/min (ref >60)
GFR calc non Af Amer: >60 mL/min (ref >60)
GLUCOSE: 129 mg/dL — AB (ref 65–99)
Potassium: 4.1 mmol/L (ref 3.5–5.1)
Sodium: 137 mmol/L (ref 135–145)

## 2017-09-29 LAB — CBC
HEMATOCRIT: 39.1 % (ref 39.0–52.0)
HEMOGLOBIN: 13.6 g/dL (ref 13.0–17.0)
MCH: 30.4 pg (ref 26.0–34.0)
MCHC: 34.8 g/dL (ref 30.0–36.0)
MCV: 87.3 fL (ref 78.0–100.0)
Platelets: 288 10*3/uL (ref 150–400)
RBC: 4.48 MIL/uL (ref 4.22–5.81)
RDW: 12.5 % (ref 11.5–15.5)
WBC: 8.6 10*3/uL (ref 4.0–10.5)

## 2017-09-29 LAB — POCT ACTIVATED CLOTTING TIME
Activated Clotting Time: 285 seconds
Activated Clotting Time: 329 seconds

## 2017-09-29 LAB — GLUCOSE, CAPILLARY
GLUCOSE-CAPILLARY: 83 mg/dL (ref 65–99)
GLUCOSE-CAPILLARY: 132 mg/dL — AB (ref 65–99)
Glucose-Capillary: 129 mg/dL — ABNORMAL HIGH (ref 65–99)
Glucose-Capillary: 177 mg/dL — ABNORMAL HIGH (ref 65–99)

## 2017-09-29 LAB — PROTIME-INR
INR: 0.92
Prothrombin Time: 12.3 seconds (ref 11.4–15.2)

## 2017-09-29 SURGERY — CORONARY STENT INTERVENTION
Anesthesia: LOCAL

## 2017-09-29 MED ORDER — PRAVASTATIN SODIUM 40 MG PO TABS
40.0000 mg | ORAL_TABLET | Freq: Every day | ORAL | Status: DC
  Administered 2017-09-29: 18:00:00 40 mg via ORAL
  Filled 2017-09-29: qty 1

## 2017-09-29 MED ORDER — IOPAMIDOL (ISOVUE-370) INJECTION 76%
INTRAVENOUS | Status: DC | PRN
  Administered 2017-09-29: 165 mL via INTRA_ARTERIAL

## 2017-09-29 MED ORDER — AMLODIPINE BESYLATE 10 MG PO TABS
10.0000 mg | ORAL_TABLET | Freq: Every day | ORAL | Status: DC
  Administered 2017-09-30: 10 mg via ORAL
  Filled 2017-09-29: qty 1

## 2017-09-29 MED ORDER — CLOPIDOGREL BISULFATE 75 MG PO TABS
75.0000 mg | ORAL_TABLET | Freq: Every day | ORAL | Status: DC
  Administered 2017-09-30: 75 mg via ORAL
  Filled 2017-09-29: qty 1

## 2017-09-29 MED ORDER — VERAPAMIL HCL 2.5 MG/ML IV SOLN
INTRA_ARTERIAL | Status: DC | PRN
  Administered 2017-09-29: 15 mL via INTRA_ARTERIAL

## 2017-09-29 MED ORDER — ONDANSETRON HCL 4 MG/2ML IJ SOLN: 4.0000 mg | Freq: Four times a day (QID) | INTRAMUSCULAR | Status: DC | PRN

## 2017-09-29 MED ORDER — LIDOCAINE HCL (PF) 1 % IJ SOLN
INTRAMUSCULAR | Status: AC
  Filled 2017-09-29: qty 30

## 2017-09-29 MED ORDER — SODIUM CHLORIDE 0.9 % IV SOLN: 250.0000 mL | INTRAVENOUS | Status: DC | PRN

## 2017-09-29 MED ORDER — NITROGLYCERIN 1 MG/10 ML FOR IR/CATH LAB
INTRA_ARTERIAL | Status: AC
  Filled 2017-09-29: qty 10

## 2017-09-29 MED ORDER — ASPIRIN 81 MG PO CHEW: 81.0000 mg | CHEWABLE_TABLET | ORAL | Status: DC

## 2017-09-29 MED ORDER — BASAGLAR KWIKPEN 100 UNIT/ML ~~LOC~~ SOPN: 60.0000 [IU] | PEN_INJECTOR | Freq: Every day | SUBCUTANEOUS | Status: DC

## 2017-09-29 MED ORDER — LIDOCAINE HCL (PF) 1 % IJ SOLN
INTRAMUSCULAR | Status: DC | PRN
  Administered 2017-09-29: 3 mL

## 2017-09-29 MED ORDER — CLOPIDOGREL BISULFATE 300 MG PO TABS
ORAL_TABLET | ORAL | Status: DC | PRN
  Administered 2017-09-29: 600 mg via ORAL

## 2017-09-29 MED ORDER — HYDRALAZINE HCL 20 MG/ML IJ SOLN: 5.0000 mg | INTRAMUSCULAR | Status: AC | PRN

## 2017-09-29 MED ORDER — IOPAMIDOL (ISOVUE-370) INJECTION 76%
INTRAVENOUS | Status: AC
  Filled 2017-09-29: qty 50

## 2017-09-29 MED ORDER — CLOPIDOGREL BISULFATE 300 MG PO TABS
ORAL_TABLET | ORAL | Status: AC
  Filled 2017-09-29: qty 1

## 2017-09-29 MED ORDER — VERAPAMIL HCL 2.5 MG/ML IV SOLN
INTRAVENOUS | Status: AC
  Filled 2017-09-29: qty 2

## 2017-09-29 MED ORDER — LABETALOL HCL 5 MG/ML IV SOLN
10.0000 mg | INTRAVENOUS | Status: AC | PRN
Start: 2017-09-29 — End: 2017-09-29

## 2017-09-29 MED ORDER — SODIUM CHLORIDE 0.9% FLUSH: 3.0000 mL | INTRAVENOUS | Status: DC | PRN

## 2017-09-29 MED ORDER — CLOPIDOGREL BISULFATE 75 MG PO TABS: 75.0000 mg | ORAL_TABLET | Freq: Every day | ORAL | 3 refills | Status: DC

## 2017-09-29 MED ORDER — SODIUM CHLORIDE 0.9% FLUSH: 3.0000 mL | Freq: Two times a day (BID) | INTRAVENOUS | Status: DC

## 2017-09-29 MED ORDER — HYDROCHLOROTHIAZIDE 12.5 MG PO CAPS
12.5000 mg | ORAL_CAPSULE | Freq: Every day | ORAL | Status: DC
  Administered 2017-09-30: 12.5 mg via ORAL
  Filled 2017-09-29: qty 1

## 2017-09-29 MED ORDER — FENTANYL CITRATE (PF) 100 MCG/2ML IJ SOLN
INTRAMUSCULAR | Status: AC
  Filled 2017-09-29: qty 2

## 2017-09-29 MED ORDER — IOPAMIDOL (ISOVUE-370) INJECTION 76%
INTRAVENOUS | Status: AC
  Filled 2017-09-29: qty 125

## 2017-09-29 MED ORDER — MIDAZOLAM HCL 2 MG/2ML IJ SOLN
INTRAMUSCULAR | Status: AC
  Filled 2017-09-29: qty 2

## 2017-09-29 MED ORDER — INSULIN GLARGINE 100 UNIT/ML ~~LOC~~ SOLN
60.0000 [IU] | Freq: Every day | SUBCUTANEOUS | Status: DC
  Administered 2017-09-29: 22:00:00 60 [IU] via SUBCUTANEOUS
  Filled 2017-09-29 (×2): qty 0.6

## 2017-09-29 MED ORDER — HEPARIN (PORCINE) IN NACL 2-0.9 UNIT/ML-% IJ SOLN
INTRAMUSCULAR | Status: AC | PRN
  Administered 2017-09-29: 500 mL

## 2017-09-29 MED ORDER — ASPIRIN EC 81 MG PO TBEC
81.0000 mg | DELAYED_RELEASE_TABLET | Freq: Every day | ORAL | Status: DC
  Administered 2017-09-30: 81 mg via ORAL
  Filled 2017-09-29: qty 1

## 2017-09-29 MED ORDER — HEPARIN SODIUM (PORCINE) 1000 UNIT/ML IJ SOLN
INTRAMUSCULAR | Status: DC | PRN
  Administered 2017-09-29: 8000 [IU] via INTRAVENOUS
  Administered 2017-09-29: 3000 [IU] via INTRAVENOUS

## 2017-09-29 MED ORDER — HEPARIN SODIUM (PORCINE) 1000 UNIT/ML IJ SOLN
INTRAMUSCULAR | Status: AC
  Filled 2017-09-29: qty 1

## 2017-09-29 MED ORDER — INSULIN ASPART 100 UNIT/ML ~~LOC~~ SOLN
20.0000 [IU] | Freq: Three times a day (TID) | SUBCUTANEOUS | Status: DC
  Administered 2017-09-29: 16 [IU] via SUBCUTANEOUS

## 2017-09-29 MED ORDER — HEPARIN (PORCINE) IN NACL 2-0.9 UNIT/ML-% IJ SOLN
INTRAMUSCULAR | Status: AC
  Filled 2017-09-29: qty 1000

## 2017-09-29 MED ORDER — MIDAZOLAM HCL 2 MG/2ML IJ SOLN
INTRAMUSCULAR | Status: DC | PRN
  Administered 2017-09-29: 2 mg via INTRAVENOUS
  Administered 2017-09-29: 1 mg via INTRAVENOUS

## 2017-09-29 MED ORDER — SODIUM CHLORIDE 0.9 % IV SOLN: INTRAVENOUS | Status: AC

## 2017-09-29 MED ORDER — SODIUM CHLORIDE 0.9% FLUSH
3.0000 mL | Freq: Two times a day (BID) | INTRAVENOUS | Status: DC
  Administered 2017-09-29: 3 mL via INTRAVENOUS

## 2017-09-29 MED ORDER — SODIUM CHLORIDE 0.9 % IV SOLN
INTRAVENOUS | Status: DC
  Administered 2017-09-29: 12:00:00 via INTRAVENOUS

## 2017-09-29 MED ORDER — FENTANYL CITRATE (PF) 100 MCG/2ML IJ SOLN
INTRAMUSCULAR | Status: DC | PRN
  Administered 2017-09-29: 25 ug via INTRAVENOUS
  Administered 2017-09-29: 50 ug via INTRAVENOUS

## 2017-09-29 MED ORDER — ACETAMINOPHEN 325 MG PO TABS
650.0000 mg | ORAL_TABLET | ORAL | Status: DC | PRN
  Administered 2017-09-29 (×2): 650 mg via ORAL
  Filled 2017-09-29 (×3): qty 2

## 2017-09-29 SURGICAL SUPPLY — 24 items
BALLN SAPPHIRE 2.0X12 (BALLOONS) ×2
BALLOON SAPPHIRE 2.0X12 (BALLOONS) IMPLANT
BAND CMPR LRG ZPHR (HEMOSTASIS) ×1
BAND ZEPHYR COMPRESS 30 LONG (HEMOSTASIS) ×1 IMPLANT
CATH LAUNCHER 6FR AL1 (CATHETERS) IMPLANT
CATH LAUNCHER 6FR EBU3.5 (CATHETERS) ×1 IMPLANT
CATHETER LAUNCHER 6FR AL1 (CATHETERS) ×2
GUIDEWIRE INQWIRE 1.5J.035X260 (WIRE) IMPLANT
INQWIRE 1.5J .035X260CM (WIRE) ×2
KIT ENCORE 26 ADVANTAGE (KITS) ×3 IMPLANT
KIT ESSENTIALS PG (KITS) ×1 IMPLANT
KIT HEART LEFT (KITS) ×2 IMPLANT
KIT HEMO VALVE WATCHDOG (MISCELLANEOUS) ×1 IMPLANT
NDL PERC 21GX4CM (NEEDLE) IMPLANT
NEEDLE PERC 21GX4CM (NEEDLE) ×2 IMPLANT
PACK CARDIAC CATHETERIZATION (CUSTOM PROCEDURE TRAY) ×2 IMPLANT
SHEATH RAIN RADIAL 21G 6FR (SHEATH) ×1 IMPLANT
STENT SIERRA 2.50 X 23 MM (Permanent Stent) ×1 IMPLANT
STENT SIERRA 3.50 X 18 MM (Permanent Stent) ×1 IMPLANT
TRANSDUCER W/STOPCOCK (MISCELLANEOUS) ×2 IMPLANT
TUBING CIL FLEX 10 FLL-RA (TUBING) ×2 IMPLANT
WIRE AQUATRAK .035X150 ANG (WIRE) ×1 IMPLANT
WIRE HI TORQ VERSACORE-J 145CM (WIRE) ×1 IMPLANT
WIRE MICROINTRODUCER 60CM (WIRE) ×1 IMPLANT

## 2017-09-29 NOTE — Interval H&P Note (Signed)
History and Physical Interval Note:  09/29/2017 1:13 PM  Peter Blake  has presented today for surgery, with the diagnosis of cad  The various methods of treatment have been discussed with the patient and family. After consideration of risks, benefits and other options for treatment, the patient has consented to  Procedure(s): CORONARY STENT INTERVENTION (N/A) as a surgical intervention .  The patient's history has been reviewed, patient examined, no change in status, stable for surgery.  I have reviewed the patient's chart and labs.  Questions were answered to the patient's satisfaction.   Symptom Status: Ischemic Symptoms Non-invasive Testing: High risk If no or indeterminate stress test, FFR/iFR results in all diseased vessels: N/A Diabetes Mellitus: Yes S/P CABG: No Antianginal therapy (number of long-acting drugs): >=2 Patient undergoing renal transplant: No Patient undergoing percutaneous valve procedure: No   1 Vessel Disease No proximal LAD involvement, No proximal left dominant LCX involvement  PCI: A (8);  Indication 2  CABG: M (6);  Indication 2 Proximal left dominant LCX involvement  PCI: A (8);  Indication 5  CABG: A (8);  Indication 5 Proximal LAD involvement  PCI: A (8);  Indication 5  CABG: A (8);  Indication 5  2 Vessel Disease No proximal LAD involvement  PCI: A (8);  Indication 8  CABG: A (7);  Indication 8 Proximal LAD involvement  PCI: A (8);  Indication 14  CABG: A (9);  Indication 14  3 Vessel Disease Low disease complexity (e.g., focal stenoses, SYNTAX <=22)  PCI: A (7);  Indication 19  CABG: A (9);  Indication 19 Intermediate or high disease complexity (e.g., SYNTAX >=23)  PCI: M (6);  Indication 23  CABG: A (9);  Indication 23  Left Main Disease Isolated LMCA disease: ostial or midshaft  PCI: A (7);  Indication 24  CABG: A (9);  Indication 24 Isolated LMCA disease: bifurcation involvement  PCI: M (6);  Indication 25  CABG: A (9);   Indication 25 LMCA ostial or midshaft, concurrent low disease burden multivessel disease (e.g., 1-2 additional focal stenoses, SYNTAX <=22)  PCI: A (7);  Indication 26  CABG: A (9);  Indication 26 LMCA ostial or midshaft, concurrent intermediate or high disease burden multivessel disease (e.g., 1-2 additional bifurcation stenoses, long stenoses, SYNTAX >=23)  PCI: M (4);  Indication 27  CABG: A (9);  Indication 27 LMCA bifurcation involvement, concurrent low disease burden multivessel disease (e.g., 1-2 additional focal stenoses, SYNTAX <=22)  PCI: M (6);  Indication 28  CABG: A (9);  Indication 28 LMCA bifurcation involvement, concurrent intermediate or high disease burden multivessel disease (e.g., 1-2 additional bifurcation stenoses, long stenoses, SYNTAX >=23)  PCI: R (3);  Indication 29  CABG: A (9);  Indication 29  Notes:  A indicates appropriate. M indicates may be appropriate. R indicates rarely appropriate. Number in parentheses is median score for that indication. Reclassify indicates number of functionally diseased vessels should be decreased given negative FFR/iFR. Re-evaluate the scenario interpreting any FFR/iFR negative vessel as being not significantly stenosed.  Disease means involved vessel provides flow to a sufficient amount of myocardium to be clinically important.  If FFR testing indicates a vessel is not significant, that vessel should not be considered diseased (and the patient should be reclassified with respect to extent of functionally significant disease).  Proximal LAD + proximal left dominant LCX is considered 3 vessel CAD  2 Vessel CAD with FFR/iFR abnormal in only 1 but not both is considered 1 vessel CAD  Disease complexity includes occlusion, bifurcation, trifurcation, ostial, >20 mm, tortuosity, calcification, thrombus  LMCA disease is >=50% by angiography, MLD <2.8 mm, MLA <6 mm2; MLA 6-7.5 mm2 requires further physiologic  See Table B for risk stratification  based on noninvasive testing  Journal of the [employer] of Cardiology Mar 2017, 23391; DOI: 10.1016/j.jacc.2017.02.001 [email or web address].de.[email or web address].full-text.pdf This App  2018 by the Society for Cardiovascular Angiography and Interventions   Adrian Prows

## 2017-09-29 NOTE — CV Procedure (Signed)
Successful PCI to mid LAD and prox anamolous LCx. Full report to follow.  Nigel Mormon, MD [facility]. [place] Pager: [phone] Office: [phone] If no answer Cell [phone]

## 2017-09-30 ENCOUNTER — Encounter (HOSPITAL_COMMUNITY): Payer: Self-pay | Admitting: Cardiology

## 2017-09-30 ENCOUNTER — Other Ambulatory Visit: Payer: Self-pay

## 2017-09-30 DIAGNOSIS — E1065 Type 1 diabetes mellitus with hyperglycemia: Secondary | ICD-10-CM | POA: Diagnosis not present

## 2017-09-30 DIAGNOSIS — E10319 Type 1 diabetes mellitus with unspecified diabetic retinopathy without macular edema: Secondary | ICD-10-CM | POA: Diagnosis not present

## 2017-09-30 DIAGNOSIS — I2582 Chronic total occlusion of coronary artery: Secondary | ICD-10-CM | POA: Diagnosis not present

## 2017-09-30 DIAGNOSIS — I25119 Atherosclerotic heart disease of native coronary artery with unspecified angina pectoris: Secondary | ICD-10-CM | POA: Diagnosis not present

## 2017-09-30 DIAGNOSIS — I2510 Atherosclerotic heart disease of native coronary artery without angina pectoris: Secondary | ICD-10-CM | POA: Diagnosis not present

## 2017-09-30 LAB — CBC
HCT: 37.8 % — ABNORMAL LOW (ref 39.0–52.0)
Hemoglobin: 12.9 g/dL — ABNORMAL LOW (ref 13.0–17.0)
MCH: 30.2 pg (ref 26.0–34.0)
MCHC: 34.1 g/dL (ref 30.0–36.0)
MCV: 88.5 fL (ref 78.0–100.0)
PLATELETS: 289 10*3/uL (ref 150–400)
RBC: 4.27 MIL/uL (ref 4.22–5.81)
RDW: 12.8 % (ref 11.5–15.5)
WBC: 9.2 10*3/uL (ref 4.0–10.5)

## 2017-09-30 LAB — BASIC METABOLIC PANEL
ANION GAP: 7 (ref 5–15)
Anion gap: 9 (ref 5–15)
BUN: 19 mg/dL (ref 6–20)
BUN: 17 mg/dL (ref 6–20)
CALCIUM: 8.7 mg/dL — AB (ref 8.9–10.3)
CHLORIDE: 104 mmol/L (ref 101–111)
CO2: 24 mmol/L (ref 22–32)
CO2: 25 mmol/L (ref 22–32)
CREATININE: 1.11 mg/dL (ref 0.61–1.24)
Calcium: 8.8 mg/dL — ABNORMAL LOW (ref 8.9–10.3)
Chloride: 104 mmol/L (ref 101–111)
Creatinine, Ser: 1.08 mg/dL (ref 0.61–1.24)
GFR calc Af Amer: >60 mL/min (ref >60)
GFR calc non Af Amer: >60 mL/min (ref >60)
Glucose, Bld: 131 mg/dL — ABNORMAL HIGH (ref 65–99)
Glucose, Bld: 208 mg/dL — ABNORMAL HIGH (ref 65–99)
Potassium: 4 mmol/L (ref 3.5–5.1)
Potassium: 3.9 mmol/L (ref 3.5–5.1)
SODIUM: 136 mmol/L (ref 135–145)
Sodium: 137 mmol/L (ref 135–145)

## 2017-09-30 LAB — GLUCOSE, CAPILLARY
Glucose-Capillary: 94 mg/dL (ref 65–99)
Glucose-Capillary: 106 mg/dL — ABNORMAL HIGH (ref 65–99)

## 2017-09-30 MED ORDER — NITROGLYCERIN 0.4 MG SL SUBL: 0.4000 mg | SUBLINGUAL_TABLET | SUBLINGUAL | 3 refills | Status: DC | PRN

## 2017-09-30 MED ORDER — ANGIOPLASTY BOOK
Freq: Once | Status: AC
  Administered 2017-09-30: 07:00:00
  Filled 2017-09-30: qty 1

## 2017-09-30 NOTE — Discharge Summary (Signed)
Physician Discharge Summary  Patient ID: Peter Blake MRN: [id number] DOB/AGE: 1966/01/28 52 y.o.  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 She underwent successful PCI with stent placement to anomalous circumflex and mid LAD.  Patient did well post procedure.  We will see him for outpatient follow-up. Patient was seen by Cardiac rehab.    Procedural Details/Technique   Technical Details Procedures: 1. Selective left main and anamolous left circumflex coronary angiography 2. Successful percutaneous coronary intervention  PTCA and stent placement 2.5 X 23 mm Xience drug-eluting stent anamolous LCx PTCA and stent placement 3.5 X 18 mm Xience drug-eluting stent mid LAD 3. Conscious sedation monitoring 79 min    Consults: None  Significant Diagnostic Studies: Labs Mental Status-Alert. General Appearance-Cooperative and Appears stated age. Build & Nutrition-Well built and Mildly obese.  Head and Neck Thyroid Gland Characteristics - normal size and consistency and no palpable nodules.  Chest and Lung Exam Chest and lung exam reveals -quiet, even and easy respiratory effort with no use of accessory muscles and non-tender. Auscultation Adventitious sounds - End inspiratory coarse crackles - Both Lung Fields(at the bases).  Cardiovascular Cardiovascular examination reveals -normal heart sounds, regular rate and rhythm with no murmurs, carotid auscultation reveals no bruits and abdominal aorta  auscultation reveals no bruits and no prominent pulsation.  Abdomen Palpation/Percussion Normal exam - Non Tender and No hepatosplenomegaly.  Peripheral Vascular Lower Extremity Inspection - Bilateral - Shiny atrophic skin, No Varicose veins. Palpation - Temperature - Bilateral - Normal. Edema - Bilateral - Trace edema. Femoral pulse - Bilateral - 1+. Popliteal pulse - Bilateral - 1+. Dorsalis pedis pulse - Bilateral - Feeble. Posterior tibial pulse - Bilateral - Normal. Carotid arteries - Bilateral-No Carotid bruit. Right radial site with no bleeding, hematoma. Good capillary refill.    Neurologic Neurologic evaluation reveals -alert and oriented x 3 with no impairment of recent or remote memory. Motor-Grossly intact without any focal deficits.  Musculoskeletal Global Assessment Left Lower Extremity - no deformities, masses or tenderness, no known fractures. Right Lower Extremity - no deformities, masses or tenderness, no known fractures.  Results for Peter Blake, Peter Blake (MRN [id number]) as of 09/30/2017 11:46  Ref. Range 09/30/2017 03:55  WBC Latest Ref Range: 4.0 - 10.5 K/uL 9.2  RBC Latest Ref Range: 4.22 - 5.81 MIL/uL 4.27  Hemoglobin Latest Ref Range: 13.0 - 17.0 g/dL 12.9 (L)  HCT Latest Ref Range: 39.0 - 52.0 % 37.8 (L)  MCV Latest Ref Range: 78.0 - 100.0 fL 88.5  MCH Latest Ref Range: 26.0 - 34.0 pg 30.2  MCHC Latest Ref Range: 30.0 - 36.0 g/dL 34.1  RDW Latest Ref Range: 11.5 - 15.5 % 12.8  Platelets Latest Ref Range: 150 - 400 K/uL 289   Results for Peter Blake, Peter Blake (MRN [id number]) as of 09/30/2017 11:46  Ref. Range 09/30/2017 57:84  BASIC METABOLIC PANEL Unknown Rpt (A)  Sodium Latest Ref Range: 135 - 145 mmol/L 136  Potassium Latest Ref Range: 3.5 - 5.1 mmol/L 3.9  Chloride Latest Ref Range: 101 - 111 mmol/L 104  CO2 Latest Ref Range:  22 - 32 mmol/L 25  Glucose Latest Ref Range: 65 - 99 mg/dL 208 (H)  BUN Latest Ref Range: 6 - 20 mg/dL 17  Creatinine  Latest Ref Range: 0.61 - 1.24 mg/dL 1.08  Calcium Latest Ref Range: 8.9 - 10.3 mg/dL 8.7 (L)  Anion gap Latest Ref Range: 5 - 15  7  GFR, Est Non African American Latest Ref Range: >60 mL/min >60  GFR, Est African American Latest Ref Range: >60 mL/min >60     Discharge Exam: Blood pressure 133/79, pulse 75, temperature 98.1 F (36.7 C), temperature source Oral, resp. rate 13, height 5\' 8"  (1.727 m), weight 90 kg (198 lb 6.6 oz), SpO2 97 %.  Mental Status-Alert. General Appearance-Cooperative and Appears stated age. Build & Nutrition-Well built and Mildly obese.  Head and Neck Thyroid Gland Characteristics - normal size and consistency and no palpable nodules.  Chest and Lung Exam Chest and lung exam reveals -quiet, even and easy respiratory effort with no use of accessory muscles and non-tender. Auscultation Adventitious sounds - End inspiratory coarse crackles - Both Lung Fields(at the bases).  Cardiovascular Cardiovascular examination reveals -normal heart sounds, regular rate and rhythm with no murmurs, carotid auscultation reveals no bruits and abdominal aorta auscultation reveals no bruits and no prominent pulsation. Right radial site with no bleeding, hematoma. Good capillary refill.    Abdomen Palpation/Percussion Normal exam - Non Tender and No hepatosplenomegaly.  Peripheral Vascular Lower Extremity Inspection - Bilateral - Shiny atrophic skin, No Varicose veins. Palpation - Temperature - Bilateral - Normal. Edema - Bilateral - Trace edema. Femoral pulse - Bilateral - 1+. Popliteal pulse - Bilateral - 1+. Dorsalis pedis pulse - Bilateral - Feeble. Posterior tibial pulse - Bilateral - Normal. Carotid arteries - Bilateral-No Carotid bruit.  Neurologic Neurologic evaluation reveals -alert and oriented x 3 with no impairment of recent or remote memory. Motor-Grossly intact without any focal deficits.  Musculoskeletal Global Assessment Left  Lower Extremity - no deformities, masses or tenderness, no known fractures. Right Lower Extremity - no deformities, masses or tenderness, no known fractures.     Disposition: 01-Home or Self Care  Discharge Instructions    Amb Referral to Cardiac Rehabilitation   Complete by:  As directed    Diagnosis:   Coronary Stents PTCA     Diet - low sodium heart healthy   Complete by:  As directed    Diet - low sodium heart healthy   Complete by:  As directed    Increase activity slowly   Complete by:  As directed    Increase activity slowly   Complete by:  As directed      Allergies as of 09/30/2017      Reactions   Augmentin [amoxicillin-pot Clavulanate] Nausea And Vomiting   Metformin And Related Diarrhea   Invokana [canagliflozin] Rash      Medication List    TAKE these medications   amLODipine 10 MG tablet Commonly known as:  NORVASC Take 10 mg by mouth daily.   aspirin EC 81 MG tablet Take 81 mg by mouth daily.   BASAGLAR KWIKPEN 100 UNIT/ML Sopn Inject 60 Units into the skin at bedtime.   CINNAMON PO Take 1 capsule by mouth daily.   clopidogrel 75 MG tablet Commonly known as:  PLAVIX Take 1 tablet (75 mg total) by mouth daily with breakfast.   CO Q 10 PO Take 1 capsule by mouth daily.   hydrochlorothiazide 12.5 MG capsule Commonly known as:  MICROZIDE Take 12.5  mg by mouth daily.   insulin lispro 100 UNIT/ML injection Commonly known as:  HUMALOG Inject 18-30 Units into the skin 3 (three) times daily before meals. Per sliding scale   LIVALO 1 MG Tabs Generic drug:  Pitavastatin Calcium Take 1 mg by mouth daily.   losartan 100 MG tablet Commonly known as:  COZAAR Take 100 mg by mouth daily.   MAGNESIUM PO Take 1 tablet by mouth daily.   nitroGLYCERIN 0.4 MG SL tablet Commonly known as:  NITROSTAT Place 1 tablet (0.4 mg total) under the tongue every 5 (five) minutes as needed for chest pain.   ONE-A-DAY MENS PO Take 1 tablet by mouth daily.    oxyCODONE-acetaminophen 5-325 MG tablet Commonly known as:  PERCOCET/ROXICET Take 1-2 tablets by mouth every 4 (four) hours as needed for moderate pain.        SignedNigel Mormon 09/30/2017, 11:43 AM  Nigel Mormon, MD [facility] [place] Pager: [phone] Office: [phone] If no answer Cell [phone]

## 2017-09-30 NOTE — Progress Notes (Addendum)
Hypoglycemic Event  CBG: 40  Treatment: 15 GM carbohydrate snack  Symptoms: Sweaty  Follow-up CBG: Time:0134 CBG Result:94  Possible Reasons for Event: inadequate meal intake

## 2017-09-30 NOTE — Progress Notes (Signed)
CARDIAC REHAB PHASE I   PRE:  Rate/Rhythm: 83 SR    BP: sitting 133/79    SaO2: 98 RA  MODE:  Ambulation: 1000 ft   POST:  Rate/Rhythm: 98 SR    BP: sitting 175/93     SaO2:   Tolerated well, no c/o. BP elevated after walk. Ed completed with pt and wife. Good reception, just seem overwhelmed. Will refer to [facility]. Understands the importance of Plavix. Also reinforced good DM control.  [place], ACSM 09/30/2017 8:58 AM

## 2017-10-01 ENCOUNTER — Telehealth (HOSPITAL_COMMUNITY): Payer: Self-pay

## 2017-10-01 LAB — GLUCOSE, CAPILLARY: Glucose-Capillary: 40 mg/dL — CL (ref 65–99)

## 2017-10-01 NOTE — Telephone Encounter (Signed)
Patients insurance is active and benefits verified through [employer] - No co-pay, deductible amount of $5,000/$5,000 has been met, no out of pocket, no co-insurance, and no pre-authorization is required. Spoke with [facility] - Reference [phone]  Patient will be contacted and scheduled upon review by the RN Navigator.

## 2017-10-09 DIAGNOSIS — E782 Mixed hyperlipidemia: Secondary | ICD-10-CM | POA: Diagnosis not present

## 2017-10-09 DIAGNOSIS — I25118 Atherosclerotic heart disease of native coronary artery with other forms of angina pectoris: Secondary | ICD-10-CM | POA: Diagnosis not present

## 2017-10-09 DIAGNOSIS — R0609 Other forms of dyspnea: Secondary | ICD-10-CM | POA: Diagnosis not present

## 2017-10-12 ENCOUNTER — Telehealth (HOSPITAL_COMMUNITY): Payer: Self-pay

## 2017-10-12 NOTE — Telephone Encounter (Signed)
Attempted to call patient in regards to Cardiac Rehab - lm on vm °

## 2017-10-13 ENCOUNTER — Telehealth (HOSPITAL_COMMUNITY): Payer: Self-pay

## 2017-10-13 NOTE — Telephone Encounter (Signed)
Patient returned phone call and is interested in the CR Program. Scheduled orientation on 12/08/17 at 7:30am. Patient will attend the 6:45am exc class. Went over insurance with patient and patient verbally stated he understands. Mailed packet.

## 2017-10-22 ENCOUNTER — Encounter (HOSPITAL_COMMUNITY): Payer: Self-pay | Admitting: Cardiology

## 2017-11-03 DIAGNOSIS — E103312 Type 1 diabetes mellitus with moderate nonproliferative diabetic retinopathy with macular edema, left eye: Secondary | ICD-10-CM | POA: Diagnosis not present

## 2017-11-03 DIAGNOSIS — E103391 Type 1 diabetes mellitus with moderate nonproliferative diabetic retinopathy without macular edema, right eye: Secondary | ICD-10-CM | POA: Diagnosis not present

## 2017-11-20 DIAGNOSIS — E109 Type 1 diabetes mellitus without complications: Secondary | ICD-10-CM | POA: Diagnosis not present

## 2017-11-20 DIAGNOSIS — I10 Essential (primary) hypertension: Secondary | ICD-10-CM | POA: Diagnosis not present

## 2017-11-20 DIAGNOSIS — E118 Type 2 diabetes mellitus with unspecified complications: Secondary | ICD-10-CM | POA: Diagnosis not present

## 2017-11-30 DIAGNOSIS — E7800 Pure hypercholesterolemia, unspecified: Secondary | ICD-10-CM | POA: Diagnosis not present

## 2017-11-30 DIAGNOSIS — I10 Essential (primary) hypertension: Secondary | ICD-10-CM | POA: Diagnosis not present

## 2017-12-07 ENCOUNTER — Telehealth (HOSPITAL_COMMUNITY): Payer: Self-pay | Admitting: *Deleted

## 2017-12-07 DIAGNOSIS — E118 Type 2 diabetes mellitus with unspecified complications: Secondary | ICD-10-CM | POA: Diagnosis not present

## 2017-12-07 DIAGNOSIS — K449 Diaphragmatic hernia without obstruction or gangrene: Secondary | ICD-10-CM | POA: Diagnosis not present

## 2017-12-07 DIAGNOSIS — I10 Essential (primary) hypertension: Secondary | ICD-10-CM | POA: Diagnosis not present

## 2017-12-08 ENCOUNTER — Ambulatory Visit (HOSPITAL_COMMUNITY): Payer: 59

## 2017-12-16 ENCOUNTER — Ambulatory Visit (HOSPITAL_COMMUNITY): Payer: 59

## 2017-12-18 ENCOUNTER — Ambulatory Visit (HOSPITAL_COMMUNITY): Payer: 59

## 2017-12-21 ENCOUNTER — Ambulatory Visit (HOSPITAL_COMMUNITY): Payer: 59

## 2017-12-23 ENCOUNTER — Ambulatory Visit (HOSPITAL_COMMUNITY): Payer: 59

## 2017-12-23 DIAGNOSIS — R0609 Other forms of dyspnea: Secondary | ICD-10-CM | POA: Diagnosis not present

## 2017-12-23 DIAGNOSIS — I25118 Atherosclerotic heart disease of native coronary artery with other forms of angina pectoris: Secondary | ICD-10-CM | POA: Diagnosis not present

## 2017-12-23 DIAGNOSIS — E782 Mixed hyperlipidemia: Secondary | ICD-10-CM | POA: Diagnosis not present

## 2017-12-25 ENCOUNTER — Ambulatory Visit (HOSPITAL_COMMUNITY): Payer: 59

## 2017-12-28 ENCOUNTER — Ambulatory Visit (HOSPITAL_COMMUNITY): Payer: 59

## 2017-12-30 ENCOUNTER — Ambulatory Visit (HOSPITAL_COMMUNITY): Payer: 59

## 2018-01-01 ENCOUNTER — Ambulatory Visit (HOSPITAL_COMMUNITY): Payer: 59

## 2018-01-04 ENCOUNTER — Ambulatory Visit (HOSPITAL_COMMUNITY): Payer: 59

## 2018-01-06 ENCOUNTER — Ambulatory Visit (HOSPITAL_COMMUNITY): Payer: 59

## 2018-01-08 ENCOUNTER — Ambulatory Visit (HOSPITAL_COMMUNITY): Payer: 59

## 2018-01-11 ENCOUNTER — Ambulatory Visit (HOSPITAL_COMMUNITY): Payer: 59

## 2018-01-13 ENCOUNTER — Ambulatory Visit (HOSPITAL_COMMUNITY): Payer: 59

## 2018-01-15 ENCOUNTER — Ambulatory Visit (HOSPITAL_COMMUNITY): Payer: 59

## 2018-01-18 ENCOUNTER — Ambulatory Visit (HOSPITAL_COMMUNITY): Payer: 59

## 2018-01-20 ENCOUNTER — Ambulatory Visit (HOSPITAL_COMMUNITY): Payer: 59

## 2018-01-22 ENCOUNTER — Ambulatory Visit (HOSPITAL_COMMUNITY): Payer: 59

## 2018-01-25 ENCOUNTER — Ambulatory Visit (HOSPITAL_COMMUNITY): Payer: 59

## 2018-01-27 ENCOUNTER — Ambulatory Visit (HOSPITAL_COMMUNITY): Payer: 59

## 2018-01-29 ENCOUNTER — Ambulatory Visit (HOSPITAL_COMMUNITY): Payer: 59

## 2018-02-01 ENCOUNTER — Encounter: Payer: Self-pay | Admitting: Internal Medicine

## 2018-02-01 ENCOUNTER — Ambulatory Visit (HOSPITAL_COMMUNITY): Payer: 59

## 2018-02-03 ENCOUNTER — Ambulatory Visit (HOSPITAL_COMMUNITY): Payer: 59

## 2018-02-05 ENCOUNTER — Ambulatory Visit (HOSPITAL_COMMUNITY): Payer: 59

## 2018-02-05 DIAGNOSIS — E103391 Type 1 diabetes mellitus with moderate nonproliferative diabetic retinopathy without macular edema, right eye: Secondary | ICD-10-CM | POA: Diagnosis not present

## 2018-02-05 DIAGNOSIS — H3561 Retinal hemorrhage, right eye: Secondary | ICD-10-CM | POA: Diagnosis not present

## 2018-02-05 DIAGNOSIS — E103592 Type 1 diabetes mellitus with proliferative diabetic retinopathy without macular edema, left eye: Secondary | ICD-10-CM | POA: Diagnosis not present

## 2018-02-08 ENCOUNTER — Ambulatory Visit (HOSPITAL_COMMUNITY): Payer: 59

## 2018-02-10 ENCOUNTER — Ambulatory Visit (HOSPITAL_COMMUNITY): Payer: 59

## 2018-02-10 DIAGNOSIS — S80819A Abrasion, unspecified lower leg, initial encounter: Secondary | ICD-10-CM | POA: Diagnosis not present

## 2018-02-10 DIAGNOSIS — E109 Type 1 diabetes mellitus without complications: Secondary | ICD-10-CM | POA: Diagnosis not present

## 2018-02-12 ENCOUNTER — Ambulatory Visit (HOSPITAL_COMMUNITY): Payer: 59

## 2018-02-15 ENCOUNTER — Ambulatory Visit (HOSPITAL_COMMUNITY): Payer: 59

## 2018-02-17 ENCOUNTER — Ambulatory Visit (HOSPITAL_COMMUNITY): Payer: 59

## 2018-02-19 ENCOUNTER — Ambulatory Visit (HOSPITAL_COMMUNITY): Payer: 59

## 2018-02-22 ENCOUNTER — Ambulatory Visit (HOSPITAL_COMMUNITY): Payer: 59

## 2018-02-24 ENCOUNTER — Ambulatory Visit (HOSPITAL_COMMUNITY): Payer: 59

## 2018-02-26 ENCOUNTER — Ambulatory Visit (HOSPITAL_COMMUNITY): Payer: 59

## 2018-03-01 ENCOUNTER — Ambulatory Visit (HOSPITAL_COMMUNITY): Payer: 59

## 2018-03-03 ENCOUNTER — Ambulatory Visit (HOSPITAL_COMMUNITY): Payer: 59

## 2018-03-05 ENCOUNTER — Ambulatory Visit (HOSPITAL_COMMUNITY): Payer: 59

## 2018-03-08 ENCOUNTER — Ambulatory Visit (HOSPITAL_COMMUNITY): Payer: 59

## 2018-03-10 ENCOUNTER — Ambulatory Visit (HOSPITAL_COMMUNITY): Payer: 59

## 2018-03-12 ENCOUNTER — Ambulatory Visit (HOSPITAL_COMMUNITY): Payer: 59

## 2018-03-15 ENCOUNTER — Ambulatory Visit (HOSPITAL_COMMUNITY): Payer: 59

## 2018-03-17 ENCOUNTER — Ambulatory Visit (HOSPITAL_COMMUNITY): Payer: 59

## 2018-03-19 ENCOUNTER — Ambulatory Visit (HOSPITAL_COMMUNITY): Payer: 59

## 2018-05-26 DIAGNOSIS — I10 Essential (primary) hypertension: Secondary | ICD-10-CM | POA: Diagnosis not present

## 2018-05-26 DIAGNOSIS — E109 Type 1 diabetes mellitus without complications: Secondary | ICD-10-CM | POA: Diagnosis not present

## 2018-05-26 DIAGNOSIS — E7800 Pure hypercholesterolemia, unspecified: Secondary | ICD-10-CM | POA: Diagnosis not present

## 2018-05-26 DIAGNOSIS — E118 Type 2 diabetes mellitus with unspecified complications: Secondary | ICD-10-CM | POA: Diagnosis not present

## 2018-06-02 DIAGNOSIS — E782 Mixed hyperlipidemia: Secondary | ICD-10-CM | POA: Diagnosis not present

## 2018-06-02 DIAGNOSIS — I25118 Atherosclerotic heart disease of native coronary artery with other forms of angina pectoris: Secondary | ICD-10-CM | POA: Diagnosis not present

## 2018-06-02 DIAGNOSIS — R0609 Other forms of dyspnea: Secondary | ICD-10-CM | POA: Diagnosis not present

## 2018-06-04 DIAGNOSIS — H43822 Vitreomacular adhesion, left eye: Secondary | ICD-10-CM | POA: Diagnosis not present

## 2018-06-04 DIAGNOSIS — E103593 Type 1 diabetes mellitus with proliferative diabetic retinopathy without macular edema, bilateral: Secondary | ICD-10-CM | POA: Diagnosis not present

## 2018-06-04 DIAGNOSIS — H3582 Retinal ischemia: Secondary | ICD-10-CM | POA: Diagnosis not present

## 2018-08-03 DIAGNOSIS — E103593 Type 1 diabetes mellitus with proliferative diabetic retinopathy without macular edema, bilateral: Secondary | ICD-10-CM | POA: Diagnosis not present

## 2018-08-23 ENCOUNTER — Emergency Department (HOSPITAL_COMMUNITY)
Admission: EM | Admit: 2018-08-23 | Discharge: 2018-08-23 | Disposition: A | Discharge status: Home / Self Care | Payer: 59 | Attending: Emergency Medicine | Admitting: Emergency Medicine

## 2018-08-23 ENCOUNTER — Other Ambulatory Visit: Payer: Self-pay

## 2018-08-23 ENCOUNTER — Encounter (HOSPITAL_COMMUNITY): Payer: Self-pay | Admitting: Emergency Medicine

## 2018-08-23 ENCOUNTER — Emergency Department (HOSPITAL_COMMUNITY): Payer: 59

## 2018-08-23 DIAGNOSIS — I10 Essential (primary) hypertension: Secondary | ICD-10-CM | POA: Diagnosis not present

## 2018-08-23 DIAGNOSIS — Z7982 Long term (current) use of aspirin: Secondary | ICD-10-CM | POA: Diagnosis not present

## 2018-08-23 DIAGNOSIS — Z794 Long term (current) use of insulin: Secondary | ICD-10-CM | POA: Insufficient documentation

## 2018-08-23 DIAGNOSIS — Z79899 Other long term (current) drug therapy: Secondary | ICD-10-CM | POA: Insufficient documentation

## 2018-08-23 DIAGNOSIS — E119 Type 2 diabetes mellitus without complications: Secondary | ICD-10-CM | POA: Insufficient documentation

## 2018-08-23 DIAGNOSIS — I259 Chronic ischemic heart disease, unspecified: Secondary | ICD-10-CM | POA: Diagnosis not present

## 2018-08-23 DIAGNOSIS — Z7902 Long term (current) use of antithrombotics/antiplatelets: Secondary | ICD-10-CM | POA: Diagnosis not present

## 2018-08-23 DIAGNOSIS — R079 Chest pain, unspecified: Secondary | ICD-10-CM | POA: Diagnosis not present

## 2018-08-23 DIAGNOSIS — R0789 Other chest pain: Secondary | ICD-10-CM | POA: Diagnosis not present

## 2018-08-23 DIAGNOSIS — R9431 Abnormal electrocardiogram [ECG] [EKG]: Secondary | ICD-10-CM | POA: Diagnosis not present

## 2018-08-23 LAB — CBC
HCT: 44.7 % (ref 39.0–52.0)
Hemoglobin: 14.9 g/dL (ref 13.0–17.0)
MCH: 29.9 pg (ref 26.0–34.0)
MCHC: 33.3 g/dL (ref 30.0–36.0)
MCV: 89.8 fL (ref 80.0–100.0)
PLATELETS: 286 10*3/uL (ref 150–400)
RBC: 4.98 MIL/uL (ref 4.22–5.81)
RDW: 12.4 % (ref 11.5–15.5)
WBC: 9 10*3/uL (ref 4.0–10.5)
nRBC: 0 % (ref 0.0–0.2)

## 2018-08-23 LAB — BASIC METABOLIC PANEL
Anion gap: 7 (ref 5–15)
BUN: 20 mg/dL (ref 6–20)
CALCIUM: 9.8 mg/dL (ref 8.9–10.3)
CO2: 27 mmol/L (ref 22–32)
Chloride: 104 mmol/L (ref 98–111)
Creatinine, Ser: 1.28 mg/dL — ABNORMAL HIGH (ref 0.61–1.24)
GFR calc Af Amer: >60 mL/min (ref >60)
GLUCOSE: 122 mg/dL — AB (ref 70–99)
POTASSIUM: 4.1 mmol/L (ref 3.5–5.1)
SODIUM: 138 mmol/L (ref 135–145)

## 2018-08-23 LAB — TROPONIN I: Troponin I: <0.03 ng/mL (ref <0.03)

## 2018-08-23 LAB — I-STAT TROPONIN, ED: TROPONIN I, POC: 0 ng/mL (ref 0.00–0.08)

## 2018-08-23 NOTE — Discharge Instructions (Signed)
As discussed, your evaluation today has been largely reassuring.  But, it is important that you monitor your condition carefully, and do not hesitate to return to the ED if you develop new, or concerning changes in your condition. ? ?Otherwise, please follow-up with your physician for appropriate ongoing care. ? ?

## 2018-08-23 NOTE — ED Triage Notes (Signed)
Pt c/o central chest pain that started about 30 minutes ago.

## 2018-08-23 NOTE — ED Provider Notes (Addendum)
[facility] DEPT Provider Note   CSN: [id number] Arrival date & time: 08/23/18  0818     History   Chief Complaint Chief Complaint  Patient presents with  . chest pain    HPI Peter Blake is a 53 y.o. male.  HPI Patient presents with concern of chest pain. Pain is actually resolved prior to my evaluation, and he describes only mild dyspnea. Patient has history of CAD, with prior stenting. He takes Plavix and aspirin daily. Today, after a stressful interaction he noted substernal chest discomfort, with dyspnea. Symptoms improved, though did not resolve without any intervention, aside from rest, withdrawal from the stressful situation. He states that he is otherwise been generally well, taking all medication as directed. He denies any other current complaints including nausea, vomiting, fever, chills, cough, weakness in any extremity. Past Medical History:  Diagnosis Date  . Coronary artery disease   . Diabetes mellitus without complication ([place])   . GERD (gastroesophageal reflux disease)   . Hyperlipidemia   . Hypertension   . Peripheral vascular disease [facility])     Patient Active Problem List   Diagnosis Date Noted  . Post PTCA 09/29/2017  . Coronary artery disease with angina pectoris ([place]) 09/27/2017  . Claudication in peripheral vascular disease ([place]) 09/20/2017  . Cholelithiasis 03/16/2014  . Cholecystitis, acute with cholelithiasis 03/16/2014  . COUGH DUE TO ACE INHIBITORS 08/13/2009  . DIABETES MELLITUS, TYPE I 11/12/2007  . HYPERCHOLESTEROLEMIA 10/11/2007  . HYPERTENSION 10/11/2007  . NUMBNESS 10/11/2007    Past Surgical History:  Procedure Laterality Date  . CHOLECYSTECTOMY N/A 03/16/2014   Procedure: LAPAROSCOPIC CHOLECYSTECTOMY WITH INTRAOPERATIVE CHOLANGIOGRAM;  Surgeon: [name], MD;  Location: [facility] ORS;  Service: General;  Laterality: N/A;  . CORONARY STENT INTERVENTION N/A 09/29/2017   Procedure: CORONARY STENT  INTERVENTION;  Surgeon: Nigel Mormon, MD;  Location: [place] CV LAB;  Service: Cardiovascular;  Laterality: N/A;  . LEFT HEART CATH AND CORONARY ANGIOGRAPHY N/A 09/22/2017   Procedure: LEFT HEART CATH AND CORONARY ANGIOGRAPHY;  Surgeon: Nigel Mormon, MD;  Location: [place] CV LAB;  Service: Cardiovascular;  Laterality: N/A;  . LOWER EXTREMITY ANGIOGRAPHY N/A 09/22/2017   Procedure: LOWER EXTREMITY ANGIOGRAPHY;  Surgeon: Nigel Mormon, MD;  Location: [place] CV LAB;  Service: Cardiovascular;  Laterality: N/A;        Home Medications    Prior to Admission medications   Medication Sig Start Date End Date Taking? Authorizing Provider  acetaminophen (TYLENOL) 325 MG tablet Take 325-650 mg by mouth every 6 (six) hours as needed for mild pain, fever or headache.   Yes [provider]  amLODipine (NORVASC) 10 MG tablet Take 10 mg by mouth daily.    Yes [provider]  aspirin EC 81 MG tablet Take 81 mg by mouth daily.   Yes [provider]  clopidogrel (PLAVIX) 75 MG tablet Take 1 tablet (75 mg total) by mouth daily with breakfast. 09/30/17  Yes Patwardhan, Manish J, MD  ezetimibe (ZETIA) 10 MG tablet Take 10 mg by mouth daily. 07/17/18  Yes [provider]  hydrochlorothiazide (MICROZIDE) 12.5 MG capsule Take 12.5 mg by mouth daily.   Yes [provider]  Insulin Glargine (BASAGLAR KWIKPEN) 100 UNIT/ML SOPN Inject 40-50 Units into the skin at bedtime.    Yes [provider]  insulin lispro (HUMALOG) 100 UNIT/ML injection Inject 18-30 Units into the skin 3 (three) times daily before meals. Per sliding scale  Yes [provider]  losartan (COZAAR) 100 MG tablet Take 100 mg by mouth daily.    Yes [provider]  nitroGLYCERIN (NITROSTAT) 0.4 MG SL tablet Place 1 tablet (0.4 mg total) under the tongue every 5 (five) minutes as needed for chest pain. 09/30/17 09/30/18 Yes Patwardhan, Manish J, MD    Pitavastatin Calcium (LIVALO) 1 MG TABS Take 1 mg by mouth daily.   Yes [provider]  polyvinyl alcohol (LIQUIFILM TEARS) 1.4 % ophthalmic solution Place 1 drop into both eyes as needed for dry eyes.   Yes [provider]  tobramycin (TOBREX) 0.3 % ophthalmic solution Place 1 drop into both eyes as directed. Prior to eye injections 08/03/18  Yes [provider]  oxyCODONE-acetaminophen (PERCOCET/ROXICET) 5-325 MG per tablet Take 1-2 tablets by mouth every 4 (four) hours as needed for moderate pain. Patient not taking: Reported on 08/23/2018 03/17/14   Earnstine Regal, PA-C    Family History No family history on file.  Social History Social History   Tobacco Use  . Smoking status: Never Smoker  . Smokeless tobacco: Never Used  Substance Use Topics  . Alcohol use: No  . Drug use: No     Allergies   Augmentin [amoxicillin-pot clavulanate]; Metformin and related; and Invokana [canagliflozin]   Review of Systems Review of Systems  Constitutional:       Per HPI, otherwise negative  HENT:       Per HPI, otherwise negative  Respiratory:       Per HPI, otherwise negative  Cardiovascular:       Per HPI, otherwise negative  Gastrointestinal: Negative for vomiting.  Endocrine:       Negative aside from HPI  Genitourinary:       Neg aside from HPI   Musculoskeletal:       Per HPI, otherwise negative  Skin: Negative.   Neurological: Negative for syncope.     Physical Exam Updated Vital Signs BP 135/80   Pulse 83   Temp 97.6 F (36.4 C) (Oral)   Resp 15   Ht 5\' 8"  (1.727 m)   Wt 93 kg   SpO2 97%   BMI 31.17 kg/m   Physical Exam Vitals signs and nursing note reviewed.  Constitutional:      General: He is not in acute distress.    Appearance: He is well-developed.  HENT:     Head: Normocephalic and atraumatic.  Eyes:     Conjunctiva/sclera: Conjunctivae normal.  Cardiovascular:     Rate and Rhythm: Normal rate and regular rhythm.   Pulmonary:     Effort: Pulmonary effort is normal. No respiratory distress.     Breath sounds: No stridor.  Abdominal:     General: There is no distension.  Skin:    General: Skin is warm and dry.  Neurological:     Mental Status: He is alert and oriented to person, place, and time.      ED Treatments / Results  Labs (all labs ordered are listed, but only abnormal results are displayed) Labs Reviewed  BASIC METABOLIC PANEL - Abnormal; Notable for the following components:      Result Value   Glucose, Bld 122 (*)    Creatinine, Ser 1.28 (*)    All other components within normal limits  CBC  TROPONIN I  I-STAT TROPONIN, ED    EKG EKG Interpretation  Date/Time:  Monday August 23 2018 08:23:19 EST Ventricular Rate:  99 PR Interval:  QRS Duration: 110 QT Interval:  342 QTC Calculation: 439 R Axis:   115 Text Interpretation:  Sinus rhythm Probable RVH w/ secondary repol abnormality Nonspecific T abnormalities, lateral leads Artifact Abnormal ekg Confirmed by Carmin Muskrat [phone]) on 08/23/2018 8:35:12 AM   Radiology Dg Chest 2 View  Result Date: 08/23/2018 CLINICAL DATA:  Onset chest pain today. EXAM: CHEST - 2 VIEW COMPARISON:  CT chest and single view of the chest 03/16/2014. FINDINGS: Lungs are clear. Heart size is normal. No pneumothorax or pleural fluid. No acute or focal bony abnormality. IMPRESSION: Negative chest. Electronically Signed   By: Inge Rise M.D.   On: 08/23/2018 08:42    Procedures Procedures (including critical care time)  Medications Ordered in ED Medications - No data to display   Initial Impression / Assessment and Plan / ED Course  I have reviewed the triage vital signs and the nursing notes.  Pertinent labs & imaging results that were available during my care of the patient were reviewed by me and considered in my medical decision making (see chart for details).    Update: Troponin #1 unremarkable. 1:17 PM Patient in no  distress, awake, alert, sitting upright, no ongoing pain. He now adds that he has had substantial amount of bloating, gas sensation, has produced substantial amounts recently, continues to deny any ongoing chest pain, dyspnea We discussed all findings including 2 normal troponin, nonischemic EKG. Encouraged patient to maintain his medication compliance with Plavix, aspirin, follow-up as scheduled with his cardiologist, without alarming findings here, the patient was discharged in stable condition.  Final Clinical Impressions(s) / ED Diagnoses   Final diagnoses:  Atypical chest pain     Carmin Muskrat, MD 08/23/18 1318    Carmin Muskrat, MD 08/23/18 1318

## 2018-08-23 NOTE — ED Notes (Signed)
Pt given sandwich and sprite.  

## 2018-08-23 NOTE — ED Notes (Signed)
Pt is alert and oriented x 4 and is verbally responsive. Pt reports that he has a lot of gas at this time. Pt denies chest pain. Pt wife is at bedside.

## 2018-09-30 ENCOUNTER — Other Ambulatory Visit: Payer: Self-pay

## 2018-09-30 DIAGNOSIS — I25119 Atherosclerotic heart disease of native coronary artery with unspecified angina pectoris: Secondary | ICD-10-CM

## 2018-09-30 MED ORDER — EZETIMIBE 10 MG PO TABS: 10.0000 mg | ORAL_TABLET | Freq: Every day | ORAL | 3 refills | Status: DC

## 2018-09-30 MED ORDER — CLOPIDOGREL BISULFATE 75 MG PO TABS: 75.0000 mg | ORAL_TABLET | Freq: Every day | ORAL | 3 refills | Status: DC

## 2018-10-01 DIAGNOSIS — E7800 Pure hypercholesterolemia, unspecified: Secondary | ICD-10-CM | POA: Diagnosis not present

## 2018-10-01 DIAGNOSIS — Z0000 Encounter for general adult medical examination without abnormal findings: Secondary | ICD-10-CM | POA: Diagnosis not present

## 2018-10-01 DIAGNOSIS — E109 Type 1 diabetes mellitus without complications: Secondary | ICD-10-CM | POA: Diagnosis not present

## 2018-10-01 DIAGNOSIS — I10 Essential (primary) hypertension: Secondary | ICD-10-CM | POA: Diagnosis not present

## 2018-10-05 DIAGNOSIS — I10 Essential (primary) hypertension: Secondary | ICD-10-CM | POA: Diagnosis not present

## 2018-10-05 DIAGNOSIS — E789 Disorder of lipoprotein metabolism, unspecified: Secondary | ICD-10-CM | POA: Diagnosis not present

## 2018-10-05 DIAGNOSIS — Z0000 Encounter for general adult medical examination without abnormal findings: Secondary | ICD-10-CM | POA: Diagnosis not present

## 2018-10-19 ENCOUNTER — Other Ambulatory Visit: Payer: Self-pay

## 2018-10-19 MED ORDER — NITROGLYCERIN 0.4 MG SL SUBL: 0.4000 mg | SUBLINGUAL_TABLET | SUBLINGUAL | 3 refills | Status: DC | PRN

## 2018-10-20 ENCOUNTER — Other Ambulatory Visit: Payer: Self-pay

## 2018-10-20 DIAGNOSIS — R079 Chest pain, unspecified: Secondary | ICD-10-CM

## 2018-10-20 MED ORDER — NITROGLYCERIN 0.4 MG SL SUBL: 0.4000 mg | SUBLINGUAL_TABLET | SUBLINGUAL | 3 refills | Status: DC | PRN

## 2018-11-17 DIAGNOSIS — E103593 Type 1 diabetes mellitus with proliferative diabetic retinopathy without macular edema, bilateral: Secondary | ICD-10-CM | POA: Diagnosis not present

## 2018-12-07 DIAGNOSIS — R600 Localized edema: Secondary | ICD-10-CM | POA: Diagnosis not present

## 2018-12-08 DIAGNOSIS — Z20828 Contact with and (suspected) exposure to other viral communicable diseases: Secondary | ICD-10-CM | POA: Diagnosis not present

## 2019-04-13 ENCOUNTER — Other Ambulatory Visit: Payer: Self-pay | Admitting: Cardiology

## 2019-04-13 DIAGNOSIS — I25119 Atherosclerotic heart disease of native coronary artery with unspecified angina pectoris: Secondary | ICD-10-CM

## 2019-05-16 ENCOUNTER — Ambulatory Visit (INDEPENDENT_AMBULATORY_CARE_PROVIDER_SITE_OTHER): Payer: 59 | Admitting: Cardiology

## 2019-05-16 ENCOUNTER — Encounter: Payer: Self-pay | Admitting: Cardiology

## 2019-05-16 ENCOUNTER — Other Ambulatory Visit: Payer: Self-pay

## 2019-05-16 VITALS — BP 140/82 | HR 78 | Ht 68.0 in | Wt 212.8 lb

## 2019-05-16 DIAGNOSIS — I10 Essential (primary) hypertension: Secondary | ICD-10-CM

## 2019-05-16 DIAGNOSIS — E782 Mixed hyperlipidemia: Secondary | ICD-10-CM | POA: Diagnosis not present

## 2019-05-16 DIAGNOSIS — I25119 Atherosclerotic heart disease of native coronary artery with unspecified angina pectoris: Secondary | ICD-10-CM

## 2019-05-16 MED ORDER — CARVEDILOL 6.25 MG PO TABS: 6.2500 mg | ORAL_TABLET | Freq: Two times a day (BID) | ORAL | 3 refills | Status: DC

## 2019-05-16 MED ORDER — LIVALO 4 MG PO TABS: 1.0000 | ORAL_TABLET | Freq: Every day | ORAL | 3 refills | Status: DC

## 2019-05-16 NOTE — Progress Notes (Signed)
Primary Physician/Referring:  Peter Gravel, MD  Patient ID: Peter Blake, male    DOB: Sep 03, 1965, 53 y.o.   MRN: [id number]  Chief Complaint  Patient presents with  . Hypertension  . Follow-up   HPI:    Peter Blake  is a 53 y.o. 53 y/o Caucasian male with CAD s/p PCI to anomaloous LCx and mid LAD (09/2017), hypertension, hyperlipidemia, statin intolerance, uncontrolled type 1 DM, erectile dysfunction, interstitial lung disease presents here for annual visit.  He is presently tolerating Livalo and Zetia.  He was seen in the emergency room for atypical chest pain on 08/23/2018 and was discharged home with diagnosis of anxiety.  He has not had recurrence of chest pain. Patient is doing well without any chest pain, shortness of breath symptoms has remained stable. He now has developed worsening diabetic retinopathy and also has peripheral neuropathy.  Past Medical History:  Diagnosis Date  . Coronary artery disease   . Diabetes mellitus without complication ([place])   . GERD (gastroesophageal reflux disease)   . Hyperlipidemia   . Hypertension   . Peripheral vascular disease [facility])    Past Surgical History:  Procedure Laterality Date  . CHOLECYSTECTOMY N/A 03/16/2014   Procedure: LAPAROSCOPIC CHOLECYSTECTOMY WITH INTRAOPERATIVE CHOLANGIOGRAM;  Surgeon: [name], MD;  [facility];  Service: General;  Laterality: N/A;  . CORONARY STENT INTERVENTION N/A 09/29/2017   Procedure: CORONARY STENT INTERVENTION;  Surgeon: Nigel Mormon, MD;  Location: [place] [facility];  Service: Cardiovascular;  Laterality: N/A;  . LEFT HEART CATH AND CORONARY ANGIOGRAPHY N/A 09/22/2017   Procedure: LEFT HEART CATH AND CORONARY ANGIOGRAPHY;  Surgeon: Nigel Mormon, MD;  Location: [place] [facility];  Service: Cardiovascular;  Laterality: N/A;  . LOWER EXTREMITY ANGIOGRAPHY N/A 09/22/2017   Procedure: LOWER EXTREMITY ANGIOGRAPHY;  Surgeon: Nigel Mormon, MD;  Location: [place] [facility];  Service: Cardiovascular;  Laterality: N/A;   Social History   Socioeconomic History  . Marital status: Married    Spouse name: Not on file  . Number of children: 0  . Years of education: Not on file  . Highest education level: Not on file  Occupational History  . Not on file  Social Needs  . Financial resource strain: Not on file  . Food insecurity    Worry: Not on file    Inability: Not on file  . Transportation needs    Medical: Not on file    Non-medical: Not on file  Tobacco Use  . Smoking status: Never Smoker  . Smokeless tobacco: Never Used  Substance and Sexual Activity  . Alcohol use: No  . Drug use: No  . Sexual activity: Yes  Lifestyle  . Physical activity    Days per week: Not on file    Minutes per session: Not on file  . Stress: Not on file  Relationships  . Social Herbalist on phone: Not on file    Gets together: Not on file    Attends religious service: Not on file    Active member of club or organization: Not on file    Attends meetings of clubs or organizations: Not on file    Relationship status: Not on file  . Intimate partner violence    Fear of current or ex partner: Not on file    Emotionally abused: Not on file    Physically abused: Not on file    Forced sexual activity: Not on  file  Other Topics Concern  . Not on file  Social History Narrative  . Not on file   ROS  Review of Systems  Constitution: Negative for chills, decreased appetite, malaise/fatigue and weight gain.  Cardiovascular: Positive for leg swelling. Negative for dyspnea on exertion and syncope.  Respiratory: Positive for snoring (severe).   Endocrine: Negative for cold intolerance.  Hematologic/Lymphatic: Does not bruise/bleed easily.  Musculoskeletal: Negative for joint swelling.  Gastrointestinal: Negative for abdominal pain, anorexia, change in bowel habit, hematochezia and melena.  Genitourinary: Positive for decreased libido.   Neurological: Negative for headaches and light-headedness.  Psychiatric/Behavioral: Negative for depression and substance abuse.  All other systems reviewed and are negative.  Objective   Vitals with BMI 05/16/2019 08/23/2018 08/23/2018  Height 5' 8"  - -  Weight 212 lbs 13 oz - -  BMI 84.53 - -  Systolic 646 - 803  Diastolic 82 - 80  Pulse 78 87 87    Blood pressure 140/82, pulse 78, height 5' 8"  (1.727 m), weight 212 lb 12.8 oz (96.5 kg), SpO2 96 %. Body mass index is 32.36 kg/m.   Physical Exam  HENT:  Head: Atraumatic.  Eyes: Conjunctivae are normal.  Neck: Neck supple. No JVD present. No thyromegaly present.  Cardiovascular: Normal rate, regular rhythm and normal heart sounds. Exam reveals no gallop.  No murmur heard. Pulses:      Carotid pulses are 2+ on the right side and 2+ on the left side.      Radial pulses are 2+ on the right side and 2+ on the left side.       Femoral pulses are 2+ on the right side and 2+ on the left side.      Popliteal pulses are 2+ on the right side and 2+ on the left side.       Dorsalis pedis pulses are 0 on the right side and 0 on the left side.       Posterior tibial pulses are 2+ on the right side and 2+ on the left side.  Bilateral leg capillary refill is normal.  Trace bilateral leg edema.  No JVD.   Pulmonary/Chest: Effort normal. He has rales (diffuse leathery coarse crackles at both bases).  Abdominal: Soft. Bowel sounds are normal.  Musculoskeletal: Normal range of motion.  Neurological: He is alert.  Skin: Skin is warm and dry.  Psychiatric: He has a normal mood and affect.    Laboratory examination:   Labs 04/26/2019: Serum glucose 176 mg, potassium 4.9, BUN 19, creatinine 1.19, eGFR 69/80 ML.  Total cholesterol 139, triglycerides 137, HDL 28, LDL 86.  Recent Labs    08/23/18 0849  NA 138  K 4.1  CL 104  CO2 27  GLUCOSE 122*  BUN 20  CREATININE 1.28*  CALCIUM 9.8  GFRNONAA >60  GFRAA >60   CMP Latest Ref Rng &  Units 08/23/2018 09/30/2017 09/30/2017  Glucose 70 - 99 mg/dL 122(H) 208(H) 131(H)  BUN 6 - 20 mg/dL 20 17 19   Creatinine 0.61 - 1.24 mg/dL 1.28(H) 1.08 1.11  Sodium 135 - 145 mmol/L 138 136 137  Potassium 3.5 - 5.1 mmol/L 4.1 3.9 4.0  Chloride 98 - 111 mmol/L 104 104 104  CO2 22 - 32 mmol/L 27 25 24   Calcium 8.9 - 10.3 mg/dL 9.8 8.7(L) 8.8(L)  Total Protein 6.0 - 8.3 g/dL - - -  Total Bilirubin 0.3 - 1.2 mg/dL - - -  Alkaline Phos 39 - 117 U/L - - -  AST 0 - 37 U/L - - -  ALT 0 - 53 U/L - - -   CBC Latest Ref Rng & Units 08/23/2018 09/30/2017 09/29/2017  WBC 4.0 - 10.5 K/uL 9.0 9.2 8.6  Hemoglobin 13.0 - 17.0 g/dL 14.9 12.9(L) 13.6  Hematocrit 39.0 - 52.0 % 44.7 37.8(L) 39.1  Platelets 150 - 400 K/uL 286 289 288   Lipid Panel  No results found for: CHOL, TRIG, HDL, CHOLHDL, VLDL, LDLCALC, LDLDIRECT HEMOGLOBIN A1C Lab Results  Component Value Date   HGBA1C 9.3 (H) 03/17/2014   MPG 220 (H) 03/17/2014   TSH No results for input(s): TSH in the last 8760 hours. Medications and allergies   Allergies  Allergen Reactions  . Augmentin [Amoxicillin-Pot Clavulanate] Nausea And Vomiting    Did it involve swelling of the face/tongue/throat, SOB, or low BP? no Did it involve sudden or severe rash/hives, skin peeling, or any reaction on the inside of your mouth or nose? no Did you need to seek medical attention at a hospital or doctor's office? no When did it last happen?2015 If all above answers are "NO", may proceed with cephalosporin use.   . Metformin And Related Diarrhea  . Invokana [Canagliflozin] Rash     Prior to Admission medications   Medication Sig Start Date End Date Taking? Authorizing Provider  acetaminophen (TYLENOL) 325 MG tablet Take 325-650 mg by mouth every 6 (six) hours as needed for mild pain, fever or headache.    [provider]  amLODipine (NORVASC) 10 MG tablet Take 10 mg by mouth daily.     [provider]  aspirin EC 81 MG tablet Take 81  mg by mouth daily.    [provider]  clopidogrel (PLAVIX) 75 MG tablet Take 1 tablet (75 mg total) by mouth daily with breakfast. 09/30/18   Adrian Prows, MD  ezetimibe (ZETIA) 10 MG tablet TAKE 1 TABLET BY MOUTH EVERY DAY 04/13/19   Adrian Prows, MD  hydrochlorothiazide (MICROZIDE) 12.5 MG capsule Take 12.5 mg by mouth daily.    [provider]  Insulin Glargine (BASAGLAR KWIKPEN) 100 UNIT/ML SOPN Inject 40-50 Units into the skin at bedtime.     [provider]  insulin lispro (HUMALOG) 100 UNIT/ML injection Inject 18-30 Units into the skin 3 (three) times daily before meals. Per sliding scale    [provider]  losartan (COZAAR) 100 MG tablet Take 100 mg by mouth daily.     [provider]  nitroGLYCERIN (NITROSTAT) 0.4 MG SL tablet Place 1 tablet (0.4 mg total) under the tongue every 5 (five) minutes as needed for chest pain. 10/19/18 10/19/19  Adrian Prows, MD  nitroGLYCERIN (NITROSTAT) 0.4 MG SL tablet Place 1 tablet (0.4 mg total) under the tongue every 5 (five) minutes as needed for chest pain. 10/20/18 01/18/19  Patwardhan, Reynold Bowen, MD  oxyCODONE-acetaminophen (PERCOCET/ROXICET) 5-325 MG per tablet Take 1-2 tablets by mouth every 4 (four) hours as needed for moderate pain. Patient not taking: Reported on 08/23/2018 03/17/14   Earnstine Regal, PA-C  Pitavastatin Calcium (LIVALO) 1 MG TABS Take 1 mg by mouth daily.    [provider]  polyvinyl alcohol (LIQUIFILM TEARS) 1.4 % ophthalmic solution Place 1 drop into both eyes as needed for dry eyes.    [provider]  tobramycin (TOBREX) 0.3 % ophthalmic solution Place 1 drop into both eyes as directed. Prior to eye injections 08/03/18   [provider]     Current Outpatient Medications  Medication Instructions  .  acetaminophen (TYLENOL) 325-650 mg, Oral, Every 6 hours PRN  . amLODipine (NORVASC) 10 mg, Oral, Daily  . aspirin EC 81 mg, Oral, Daily  . clopidogrel (PLAVIX) 75 mg,  Oral, Daily with breakfast  . ezetimibe (ZETIA) 10 MG tablet TAKE 1 TABLET BY MOUTH EVERY DAY  . hydrochlorothiazide (MICROZIDE) 12.5 mg, Oral, Daily  . Insulin Glargine (TOUJEO SOLOSTAR [place]) Subcutaneous, 10-12 units at bedtime   . insulin lispro (HUMALOG) 20-25 Units, Subcutaneous, 3 times daily before meals, Per sliding scale  . losartan (COZAAR) 100 mg, Daily  . nitroGLYCERIN (NITROSTAT) 0.4 mg, Sublingual, Every 5 min PRN  . Pitavastatin Calcium (LIVALO) 1 mg, Oral, Daily  . polyvinyl alcohol (LIQUIFILM TEARS) 1.4 % ophthalmic solution 1 drop, Both Eyes, As needed  . tobramycin (TOBREX) 0.3 % ophthalmic solution 1 drop, Both Eyes, As directed, Prior to eye injections    Radiology:  No results found.   Cardiac Studies:   Echocardiogram 09/02/2017: Left ventricle cavity is normal in size. Moderate concentric hypertrophy of the left ventricle. Normal global wall motion. Doppler evidence of grade I (impaired) diastolic dysfunction, normal LAP. Calculated EF 55%. Mild to moderate mitral regurgitation. Inadequate tricuspid regurgitation jet to estimate pulmonary artery pressure. Normal right atrial pressure.  Abdominal angiogram 09/22/2017:  Abdominal aorta distal 30%. No significant PAD. Slow flow. No Iliac artery disease.   Coronary angiogram 09/29/2017:  Severe multivessel CAD: LAD: Mid LAD 80% stenosis, Anomalous LCx: Prox 80% stenosis RCA on angiogram 09/22/16, PL branch 100% occlusion, diffuse disease with grade 1 collaterals from LAD septal perforators. S/P  2.5 X 23 mm Xience DES to anamolous LCx  and  3.5 X 18 mm Xience DES to mid LAD w 0% residual stenosis.  Assessment     ICD-10-CM   1. Coronary artery disease involving native coronary artery of native heart with angina pectoris ([place])  I25.119 EKG 12-Lead  2. Mixed hyperlipidemia  E78.2   3. Essential hypertension  I10     EKG 05/16/2019: Normal sinus rhythm at the rate of 70 bpm, normal axis.  IVCD, Nonspecific T-wave  flattening. No significant change from    EKG 06/02/2018.  Recommendations:   Patient with known coronary artery disease, multivessel disease and is undergone complex angioplasty and stenting to anomalous circumflex from right and to the mid LAD on 09/29/2017, diabetic retinopathy and peripheral neuropathy, hyperlipidemia, hypertension presents here for annual visit.  He is now developed worsening diabetic retinopathy.  To improve his LDL, will increase Livalo from 1 mg to 4 mg, if he indeed develops any myalgias the could certainly reduce the dose in half.  Continue Zetia.  Discontinue Plavix (completed course)  He has not had any angina pectoris, no changes in his EKG.  No change in vascular examination well.  Blood pressure is well controlled.  I'll repeat lipid profile testing in 6 weeks.  I'll see him back in 6 weeks for f/u of hypertension and hyperlipidemia.  Adrian Prows, MD, [facility] 05/16/2019, 1:26 PM [facility]. [place] Pager: [phone] Office: [phone] If no answer Cell [phone]

## 2019-06-06 ENCOUNTER — Telehealth: Payer: Self-pay

## 2019-06-06 NOTE — Telephone Encounter (Signed)
Yes he may stop and I will address Coreg on his next visit, maybe try metoprolol

## 2019-06-06 NOTE — Telephone Encounter (Signed)
Pt called stating that since he was put on th beta blocker he has been having diarrhea, tired/ sleeping all day, depression; He wants to know is there anyway that he can come off of it

## 2019-06-07 NOTE — Telephone Encounter (Signed)
Left detailed vm pt may call back

## 2019-06-23 ENCOUNTER — Ambulatory Visit (INDEPENDENT_AMBULATORY_CARE_PROVIDER_SITE_OTHER): Payer: 59 | Admitting: Pulmonary Disease

## 2019-06-23 ENCOUNTER — Encounter: Payer: Self-pay | Admitting: Pulmonary Disease

## 2019-06-23 ENCOUNTER — Other Ambulatory Visit: Payer: Self-pay

## 2019-06-23 DIAGNOSIS — J849 Interstitial pulmonary disease, unspecified: Secondary | ICD-10-CM | POA: Diagnosis not present

## 2019-06-23 NOTE — Patient Instructions (Signed)
We will schedule you for high-resolution CT for better evaluation of your lungs Follow-up in 2 to 4 weeks.

## 2019-06-23 NOTE — Progress Notes (Signed)
Peter Blake    OT:[id number]    06-30-1966  Primary Care Physician:Kim, Jeneen Rinks, MD  Referring Physician: [name], MD [address] [place],  [place] [id number]  Chief complaint: Consult for abnormal CT, interstitial lung disease  HPI: 53 year old with history of coronary artery disease, hypertension, hyperlipidemia, diabetes Complains of dyspnea on exertion for the past 2 to 3 years.  He has cough, nonproductive in nature which occurs mostly in the morning.  He had chest CT and x-ray in 2015 question of interstitial lung disease and has been referred here for further evaluation  History significant for coronary artery disease s/p stenting in March 2019.  Follows with Dr. 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  His wife brought down pillows and comforters recently in November 2020 Smoking history: Never smoker.  Had smoked crack cocaine and abused alcohol up to 1999 Travel history: Originally from [place].  No significant recent travel Relevant family history: Patient is adopted.  Reports that his adopted father died of mesothelioma from asbestos exposure.  His mother died from unspecified lung cancer and the suspicion that she may have been exposed to asbestos from her husband's clothes.  Outpatient Encounter Medications as of 06/23/2019  Medication Sig  . acetaminophen (TYLENOL) 325 MG tablet Take 325-650 mg by mouth every 6 (six) hours as needed for mild pain, fever or headache.  Marland Kitchen amLODipine (NORVASC) 10 MG tablet Take 10 mg by mouth daily.   Marland Kitchen aspirin EC 81 MG tablet Take 81 mg by mouth daily.  . carvedilol (COREG) 6.25 MG tablet Take 1 tablet (6.25 mg total) by mouth 2 (two) times daily.  . clopidogrel (PLAVIX) 75 MG tablet Take 75 mg by mouth daily.  Marland Kitchen  ezetimibe (ZETIA) 10 MG tablet TAKE 1 TABLET BY MOUTH EVERY DAY  . hydrochlorothiazide (MICROZIDE) 12.5 MG capsule Take 12.5 mg by mouth daily.  . Insulin Glargine (TOUJEO SOLOSTAR [place]) Inject into the skin. 15 units at bedtime  . insulin lispro (HUMALOG) 100 UNIT/ML injection Inject 20-30 Units into the skin 3 (three) times daily before meals. Per sliding scale  . losartan (COZAAR) 100 MG tablet Take 100 mg by mouth daily.   . nitroGLYCERIN (NITROSTAT) 0.4 MG SL tablet Place 1 tablet (0.4 mg total) under the tongue every 5 (five) minutes as needed for chest pain.  Marland Kitchen omeprazole (PRILOSEC) 20 MG capsule Take 20 mg by mouth daily.  . Pitavastatin Calcium (LIVALO) 4 MG TABS Take 1 tablet (4 mg total) by mouth daily.  . polyvinyl alcohol (LIQUIFILM TEARS) 1.4 % ophthalmic solution Place 1 drop into both eyes as needed for dry eyes.  . sildenafil (VIAGRA) 100 MG tablet Take 100 mg by mouth as needed for erectile dysfunction.  Marland Kitchen tobramycin (TOBREX) 0.3 % ophthalmic solution Place 1 drop into both eyes as directed. Prior to eye injections  . [DISCONTINUED] TOUJEO SOLOSTAR 300 UNIT/ML SOPN 60 UNITS ONCE A DAY SUBCUTANEOUS 30 DAYS   No facility-administered encounter medications on file as of 06/23/2019.     Allergies as of 06/23/2019 - Review Complete 06/23/2019  Allergen Reaction Noted  . Augmentin [amoxicillin-pot clavulanate] Nausea And Vomiting 03/16/2014  . Metformin and related Diarrhea 03/16/2014  . Invokana [canagliflozin] Rash 03/16/2014    Past Medical History:  Diagnosis Date  . Coronary artery disease   . Diabetes mellitus without complication ([place])   . GERD (gastroesophageal reflux disease)   . Hyperlipidemia   . Hypertension   . Peripheral vascular disease [facility])     Past Surgical History:  Procedure Laterality Date  . CHOLECYSTECTOMY N/A 03/16/2014   Procedure: LAPAROSCOPIC CHOLECYSTECTOMY WITH INTRAOPERATIVE CHOLANGIOGRAM;  Surgeon: [name], MD;  Location: [facility];   Service: General;  Laterality: N/A;  . CORONARY STENT INTERVENTION N/A 09/29/2017   Procedure: CORONARY STENT INTERVENTION;  Surgeon: Nigel Mormon, MD;  Location: [place] [facility];  Service: Cardiovascular;  Laterality: N/A;  . LEFT HEART CATH AND CORONARY ANGIOGRAPHY N/A 09/22/2017   Procedure: LEFT HEART CATH AND CORONARY ANGIOGRAPHY;  Surgeon: Nigel Mormon, MD;  Location: [place] [facility];  Service: Cardiovascular;  Laterality: N/A;  . LOWER EXTREMITY ANGIOGRAPHY N/A 09/22/2017   Procedure: LOWER EXTREMITY ANGIOGRAPHY;  Surgeon: Nigel Mormon, MD;  Location: [place] [facility];  Service: Cardiovascular;  Laterality: N/A;    Family History  Adopted: Yes  Problem Relation Age of Onset  . Cancer Mother   . Heart attack Mother   . Lung cancer Father     Social History   Socioeconomic History  . Marital status: Married    Spouse name: Not on file  . Number of children: 0  . Years of education: Not on file  . Highest education level: Not on file  Occupational History  . Not on file  Social Needs  . Financial resource strain: Not on file  . Food insecurity    Worry: Not on file    Inability: Not on file  . Transportation needs    Medical: Not on file    Non-medical: Not on file  Tobacco Use  . Smoking status: Never Smoker  . Smokeless tobacco: Never Used  Substance and Sexual Activity  . Alcohol use: No  . Drug use: No  . Sexual activity: Yes  Lifestyle  . Physical activity    Days per week: Not on file    Minutes per session: Not on file  . Stress: Not on file  Relationships  . Social Herbalist on phone: Not on file    Gets together: Not on file    Attends religious service: Not on file    Active member of club or organization: Not on file    Attends meetings of clubs or organizations: Not on file    Relationship status: Not on file  . Intimate partner violence    Fear of current or ex partner: Not on file    Emotionally abused:  Not on file    Physically abused: Not on file    Forced sexual activity: Not on file  Other Topics Concern  . Not on file  Social History Narrative  . Not on file    Review of systems: Review of Systems  Constitutional: Negative for fever and chills.  HENT: Negative.   Eyes: Negative for blurred vision.  Respiratory: as per HPI  Cardiovascular: Negative for chest pain and palpitations.  Gastrointestinal: Negative for vomiting, diarrhea, blood per rectum. Genitourinary: Negative for dysuria, urgency, frequency and hematuria.  Musculoskeletal: Negative for myalgias, back pain and joint pain.  Skin: Negative for itching and rash.  Neurological: Negative for dizziness, tremors, focal weakness, seizures and loss of consciousness.  Endo/Heme/Allergies: Negative for environmental allergies.  Psychiatric/Behavioral: Negative for depression, suicidal ideas and hallucinations.  All  other systems reviewed and are negative.  Physical Exam: Blood pressure 120/76, pulse 76, temperature 97.8 F (36.6 C), temperature source Temporal, height 5\' 8"  (1.727 m), weight 214 lb 9.6 oz (97.3 kg), SpO2 98 %. Gen:      No acute distress HEENT:  EOMI, sclera anicteric Neck:     No masses; no thyromegaly Lungs:    Clear to auscultation bilaterally; normal respiratory effort CV:         Regular rate and rhythm; no murmurs Abd:      + bowel sounds; soft, non-tender; no palpable masses, no distension Ext:    No edema; adequate peripheral perfusion Skin:      Warm and dry; no rash Neuro: alert and oriented x 3 Psych: normal mood and affect  Data Reviewed: Imaging: CTA 03/16/2014-no pulmonary embolism pulmonary vascular congestion, groundglass opacities with prominent interstitial markings.  Moderate hiatal hernia, cholelithiasis.    Chest x-ray 08/23/2018-no acute cardiopulmonary disease. I have reviewed the images personally.  Assessment:  Evaluation for interstitial lung disease Imaging from 2015 with  groundglass opacities which may be secondary to vascular congestion in the setting of heart disease. Follow-up chest x-ray is unremarkable It is not clear to me if there is any underlying interstitial lung disease.  There may have been some asbestos exposure from his father's job  We will schedule high-res CT for better evaluation  Plan/Recommendations: -High-resolution CT  Marshell Garfinkel MD  Pulmonary and Critical Care 06/23/2019, 11:21 AM  CC: [name], MD

## 2019-06-28 ENCOUNTER — Ambulatory Visit: Payer: 59 | Admitting: Cardiology

## 2019-06-29 ENCOUNTER — Ambulatory Visit (INDEPENDENT_AMBULATORY_CARE_PROVIDER_SITE_OTHER)
Admission: RE | Admit: 2019-06-29 | Discharge: 2019-06-29 | Disposition: A | Discharge status: Home / Self Care | Payer: 59 | Source: Ambulatory Visit | Attending: Pulmonary Disease | Admitting: Pulmonary Disease

## 2019-06-29 ENCOUNTER — Other Ambulatory Visit: Payer: Self-pay

## 2019-06-29 DIAGNOSIS — J849 Interstitial pulmonary disease, unspecified: Secondary | ICD-10-CM | POA: Diagnosis not present

## 2019-06-30 ENCOUNTER — Telehealth: Payer: Self-pay | Admitting: Pulmonary Disease

## 2019-06-30 NOTE — Telephone Encounter (Signed)
Spoke with the pt  He is requesting ct chest results from 06/29/2019  Please advise thanks!

## 2019-06-30 NOTE — Telephone Encounter (Signed)
Called and spoke with pt letting him know the results of the ct. Pt verbalized understanding. Nothing further needed.

## 2019-06-30 NOTE — Telephone Encounter (Signed)
CT shows no evidence of interstitial lung disease.  There are benign findings of a small pulmonary nodule and something called air trapping which may indicate mild asthma.  I will discuss in detail at return visit

## 2019-07-01 ENCOUNTER — Encounter: Payer: Self-pay | Admitting: Cardiology

## 2019-07-01 ENCOUNTER — Ambulatory Visit (INDEPENDENT_AMBULATORY_CARE_PROVIDER_SITE_OTHER): Payer: 59 | Admitting: Cardiology

## 2019-07-01 ENCOUNTER — Other Ambulatory Visit: Payer: Self-pay

## 2019-07-01 VITALS — BP 147/83 | HR 80 | Temp 98.4°F | Resp 16 | Ht 68.0 in | Wt 214.3 lb

## 2019-07-01 DIAGNOSIS — I25119 Atherosclerotic heart disease of native coronary artery with unspecified angina pectoris: Secondary | ICD-10-CM | POA: Diagnosis not present

## 2019-07-01 DIAGNOSIS — I10 Essential (primary) hypertension: Secondary | ICD-10-CM

## 2019-07-01 DIAGNOSIS — E782 Mixed hyperlipidemia: Secondary | ICD-10-CM | POA: Diagnosis not present

## 2019-07-01 MED ORDER — LIVALO 4 MG PO TABS: 1.0000 | ORAL_TABLET | Freq: Every day | ORAL | 3 refills | Status: DC

## 2019-07-01 NOTE — Progress Notes (Signed)
Primary Physician/Referring:  [name], MD  Patient ID: Peter Blake, male    DOB: May 17, 1966, 53 y.o.   MRN: [id number]  Chief Complaint  Patient presents with  . Coronary Artery Disease  . Hypertension  . Hyperlipidemia   HPI:    Peter Blake  is a 53 y.o. 53 y/o Caucasian male with CAD s/p PCI to anomaloous LCx and mid LAD (09/2017), hypertension, hyperlipidemia, statin intolerance, uncontrolled type 1 DM, erectile dysfunction, interstitial lung disease presents here for annual visit.  He is presently tolerating Livalo and Zetia.  He was seen in the emergency room for atypical chest pain on 08/23/2018 and was discharged home with diagnosis of anxiety.  He has not had recurrence of chest pain. Patient is doing well without any chest pain, shortness of breath symptoms has remained stable. He now has developed worsening diabetic retinopathy and also has peripheral neuropathy.  Past Medical History:  Diagnosis Date  . Coronary artery disease   . Diabetes mellitus without complication ([place])   . GERD (gastroesophageal reflux disease)   . Hyperlipidemia   . Hypertension   . Peripheral vascular disease [facility])    Past Surgical History:  Procedure Laterality Date  . CHOLECYSTECTOMY N/A 03/16/2014   Procedure: LAPAROSCOPIC CHOLECYSTECTOMY WITH INTRAOPERATIVE CHOLANGIOGRAM;  Surgeon: [name], MD;  Location: [facility];  Service: General;  Laterality: N/A;  . CORONARY STENT INTERVENTION N/A 09/29/2017   Procedure: CORONARY STENT INTERVENTION;  Surgeon: Nigel Mormon, MD;  Location: [place] [facility];  Service: Cardiovascular;  Laterality: N/A;  . LEFT HEART CATH AND CORONARY ANGIOGRAPHY N/A 09/22/2017   Procedure: LEFT HEART CATH AND CORONARY ANGIOGRAPHY;  Surgeon: Nigel Mormon, MD;  Location: [place] [facility];  Service: Cardiovascular;  Laterality: N/A;  . LOWER EXTREMITY ANGIOGRAPHY N/A 09/22/2017   Procedure: LOWER EXTREMITY ANGIOGRAPHY;  Surgeon: Nigel Mormon, MD;  Location: [place] [facility];  Service: Cardiovascular;  Laterality: N/A;   Social History   Socioeconomic History  . Marital status: Married    Spouse name: Not on file  . Number of children: 0  . Years of education: Not on file  . Highest education level: Not on file  Occupational History  . Not on file  Tobacco Use  . Smoking status: Never Smoker  . Smokeless tobacco: Never Used  Substance and Sexual Activity  . Alcohol use: No  . Drug use: No  . Sexual activity: Yes  Other Topics Concern  . Not on file  Social History Narrative  . Not on file   Social Determinants of Health   Financial Resource Strain:   . Difficulty of Paying Living Expenses: Not on file  Food Insecurity:   . Worried About Charity fundraiser in the Last Year: Not on file  . Ran Out of Food in the Last Year: Not on file  Transportation Needs:   . Lack of Transportation (Medical): Not on file  . Lack of Transportation (Non-Medical): Not on file  Physical Activity:   . Days of Exercise per Week: Not on file  . Minutes of Exercise per Session: Not on file  Stress:   . Feeling of Stress : Not on file  Social Connections:   . Frequency of Communication with Friends and Family: Not on file  . Frequency of Social Gatherings with Friends and Family: Not on file  . Attends Religious Services: Not on file  . Active Member of Clubs or Organizations: Not on  file  . Attends Archivist Meetings: Not on file  . Marital Status: Not on file  Intimate Partner Violence:   . Fear of Current or Ex-Partner: Not on file  . Emotionally Abused: Not on file  . Physically Abused: Not on file  . Sexually Abused: Not on file   ROS  Review of Systems  Constitution: Negative for chills, decreased appetite, malaise/fatigue and weight gain.  Cardiovascular: Positive for leg swelling. Negative for dyspnea on exertion and syncope.  Respiratory: Positive for snoring (severe).   Endocrine: Negative  for cold intolerance.  Hematologic/Lymphatic: Does not bruise/bleed easily.  Musculoskeletal: Negative for joint swelling.  Gastrointestinal: Positive for heartburn (occasional). Negative for abdominal pain, anorexia, change in bowel habit, hematochezia and melena.  Genitourinary: Positive for decreased libido.  Neurological: Negative for headaches and light-headedness.  Psychiatric/Behavioral: Negative for depression and substance abuse.  All other systems reviewed and are negative.  Objective   Vitals with BMI 07/01/2019 06/23/2019 05/16/2019  Height 5' 8"  5' 8"  5' 8"   Weight 214 lbs 5 oz 214 lbs 10 oz 212 lbs 13 oz  BMI 32.59 19.16 60.60  Systolic 045 997 741  Diastolic 83 76 82  Pulse 80 76 78    Blood pressure (!) 147/83, pulse 80, temperature 98.4 F (36.9 C), resp. rate 16, height 5' 8"  (1.727 m), weight 214 lb 4.8 oz (97.2 kg), SpO2 99 %. Body mass index is 32.58 kg/m.   Physical Exam  HENT:  Head: Atraumatic.  Eyes: Conjunctivae are normal.  Neck: No JVD present. No thyromegaly present.  Cardiovascular: Normal rate, regular rhythm and normal heart sounds. Exam reveals no gallop.  No murmur heard. Pulses:      Carotid pulses are 2+ on the right side and 2+ on the left side.      Radial pulses are 2+ on the right side and 2+ on the left side.       Femoral pulses are 2+ on the right side and 2+ on the left side.      Popliteal pulses are 2+ on the right side and 2+ on the left side.       Dorsalis pedis pulses are 0 on the right side and 0 on the left side.       Posterior tibial pulses are 2+ on the right side and 2+ on the left side.  Bilateral leg capillary refill is normal.  Trace bilateral leg edema.  No JVD.   Pulmonary/Chest: Effort normal. He has rales (diffuse leathery coarse crackles at both bases).  Abdominal: Soft. Bowel sounds are normal.  Musculoskeletal:        General: Normal range of motion.     Cervical back: Neck supple.  Neurological: He is  alert.  Skin: Skin is warm and dry.  Psychiatric: He has a normal mood and affect.    Laboratory examination:   Recent Labs    08/23/18 0849  NA 138  K 4.1  CL 104  CO2 27  GLUCOSE 122*  BUN 20  CREATININE 1.28*  CALCIUM 9.8  GFRNONAA >60  GFRAA >60   CMP Latest Ref Rng & Units 08/23/2018 09/30/2017 09/30/2017  Glucose 70 - 99 mg/dL 122(H) 208(H) 131(H)  BUN 6 - 20 mg/dL 20 17 19   Creatinine 0.61 - 1.24 mg/dL 1.28(H) 1.08 1.11  Sodium 135 - 145 mmol/L 138 136 137  Potassium 3.5 - 5.1 mmol/L 4.1 3.9 4.0  Chloride 98 - 111 mmol/L 104 104 104  CO2  22 - 32 mmol/L 27 25 24   Calcium 8.9 - 10.3 mg/dL 9.8 8.7(L) 8.8(L)  Total Protein 6.0 - 8.3 g/dL - - -  Total Bilirubin 0.3 - 1.2 mg/dL - - -  Alkaline Phos 39 - 117 U/L - - -  AST 0 - 37 U/L - - -  ALT 0 - 53 U/L - - -   CBC Latest Ref Rng & Units 08/23/2018 09/30/2017 09/29/2017  WBC 4.0 - 10.5 K/uL 9.0 9.2 8.6  Hemoglobin 13.0 - 17.0 g/dL 14.9 12.9(L) 13.6  Hematocrit 39.0 - 52.0 % 44.7 37.8(L) 39.1  Platelets 150 - 400 K/uL 286 289 288   Lipid Panel  No results found for: CHOL, TRIG, HDL, CHOLHDL, VLDL, LDLCALC, LDLDIRECT HEMOGLOBIN A1C Lab Results  Component Value Date   HGBA1C 9.3 (H) 03/17/2014   MPG 220 (H) 03/17/2014   TSH No results for input(s): TSH in the last 8760 hours.   Labs 04/26/2019: Serum glucose 176 mg, potassium 4.9, BUN 19, creatinine 1.19, eGFR 69/80 ML.  Total cholesterol 139, triglycerides 137, HDL 28, LDL 86.  Medications and allergies   Allergies  Allergen Reactions  . Coreg [Carvedilol] Other (See Comments)    Fatigue  . Augmentin [Amoxicillin-Pot Clavulanate] Nausea And Vomiting    Did it involve swelling of the face/tongue/throat, SOB, or low BP? no Did it involve sudden or severe rash/hives, skin peeling, or any reaction on the inside of your mouth or nose? no Did you need to seek medical attention at a hospital or doctor's office? no When did it last happen?2015 If all above  answers are "NO", may proceed with cephalosporin use.   . Metformin And Related Diarrhea  . Metoprolol Other (See Comments)    Diarrhoea  . Invokana [Canagliflozin] Rash     Prior to Admission medications   Medication Sig Start Date End Date Taking? Authorizing Provider  acetaminophen (TYLENOL) 325 MG tablet Take 325-650 mg by mouth every 6 (six) hours as needed for mild pain, fever or headache.    [provider]  amLODipine (NORVASC) 10 MG tablet Take 10 mg by mouth daily.     [provider]  aspirin EC 81 MG tablet Take 81 mg by mouth daily.    [provider]  clopidogrel (PLAVIX) 75 MG tablet Take 1 tablet (75 mg total) by mouth daily with breakfast. 09/30/18   Adrian Prows, MD  ezetimibe (ZETIA) 10 MG tablet TAKE 1 TABLET BY MOUTH EVERY DAY 04/13/19   Adrian Prows, MD  hydrochlorothiazide (MICROZIDE) 12.5 MG capsule Take 12.5 mg by mouth daily.    [provider]  Insulin Glargine (BASAGLAR KWIKPEN) 100 UNIT/ML SOPN Inject 40-50 Units into the skin at bedtime.     [provider]  insulin lispro (HUMALOG) 100 UNIT/ML injection Inject 18-30 Units into the skin 3 (three) times daily before meals. Per sliding scale    [provider]  losartan (COZAAR) 100 MG tablet Take 100 mg by mouth daily.     [provider]  nitroGLYCERIN (NITROSTAT) 0.4 MG SL tablet Place 1 tablet (0.4 mg total) under the tongue every 5 (five) minutes as needed for chest pain. 10/19/18 10/19/19  Adrian Prows, MD  nitroGLYCERIN (NITROSTAT) 0.4 MG SL tablet Place 1 tablet (0.4 mg total) under the tongue every 5 (five) minutes as needed for chest pain. 10/20/18 01/18/19  Patwardhan, Reynold Bowen, MD  oxyCODONE-acetaminophen (PERCOCET/ROXICET) 5-325 MG per tablet Take 1-2 tablets by mouth every 4 (four) hours as  needed for moderate pain. Patient not taking: Reported on 08/23/2018 03/17/14   Earnstine Regal, PA-C  Pitavastatin Calcium (LIVALO) 1 MG TABS Take 1 mg by mouth  daily.    [provider]  polyvinyl alcohol (LIQUIFILM TEARS) 1.4 % ophthalmic solution Place 1 drop into both eyes as needed for dry eyes.    [provider]  tobramycin (TOBREX) 0.3 % ophthalmic solution Place 1 drop into both eyes as directed. Prior to eye injections 08/03/18   [provider]     Current Outpatient Medications  Medication Instructions  . amLODipine (NORVASC) 10 mg, Oral, Daily  . aspirin EC 81 mg, Oral, Daily  . ezetimibe (ZETIA) 10 MG tablet TAKE 1 TABLET BY MOUTH EVERY DAY  . hydrochlorothiazide (MICROZIDE) 12.5 mg, Oral, Daily  . Insulin Glargine (TOUJEO SOLOSTAR [place]) Subcutaneous, 15 units at bedtime  . insulin lispro (HUMALOG) 20-30 Units, Subcutaneous, 3 times daily before meals, Per sliding scale  . Livalo 4 mg, Oral, Daily  . losartan (COZAAR) 100 mg, Daily  . nitroGLYCERIN (NITROSTAT) 0.4 mg, Sublingual, Every 5 min PRN  . polyvinyl alcohol (LIQUIFILM TEARS) 1.4 % ophthalmic solution 1 drop, Both Eyes, As needed  . sildenafil (VIAGRA) 100 mg, Oral, As needed  . tobramycin (TOBREX) 0.3 % ophthalmic solution 1 drop, Both Eyes, As directed, Prior to eye injections    Radiology:  CT Chest High Resolution  Result Date: 06/29/2019 CLINICAL DATA:  Interstitial lung disease EXAM: CT CHEST WITHOUT CONTRAST TECHNIQUE: Multidetector CT imaging of the chest was performed following the standard protocol without intravenous contrast. High resolution imaging of the lungs, as well as inspiratory and expiratory imaging, was performed. COMPARISON:  CT chest angiogram, 03/16/2014 FINDINGS: Cardiovascular: Scattered aortic atherosclerosis. Coronary artery calcifications and/or stents. Normal heart size. No pericardial effusion. Mediastinum/Nodes: No enlarged mediastinal, hilar, or axillary lymph nodes. Moderate hiatal hernia. Thyroid gland, trachea, and esophagus demonstrate no significant findings. Lungs/Pleura: Lungs are clear. Stable, benign 3 mm  pulmonary nodule of the left lower lobe (series 4, image 176). Mild lobular air trapping. No pleural effusion or pneumothorax. Upper Abdomen: No acute abnormality. Musculoskeletal: No chest wall mass or suspicious bone lesions identified. IMPRESSION: 1. No evidence of fibrotic interstitial lung disease. 2. Mild lobular air trapping, suggestive of small airways disease. 3. Moderate hiatal hernia. 4. Coronary artery disease.  Aortic Atherosclerosis (ICD10-I70.0). Electronically Signed   By: Eddie Candle M.D.   On: 06/29/2019 15:03   Cardiac Studies:   Echocardiogram 09/02/2017: Left ventricle cavity is normal in size. Moderate concentric hypertrophy of the left ventricle. Normal global wall motion. Doppler evidence of grade I (impaired) diastolic dysfunction, normal LAP. Calculated EF 55%. Mild to moderate mitral regurgitation. Inadequate tricuspid regurgitation jet to estimate pulmonary artery pressure. Normal right atrial pressure.  Abdominal angiogram 09/22/2017:  Abdominal aorta distal 30%. No significant PAD. Slow flow. No Iliac artery disease.   Coronary angiogram 09/29/2017:  Severe multivessel CAD: LAD: Mid LAD 80% stenosis, Anomalous LCx: Prox 80% stenosis RCA on angiogram 09/22/16, PL branch 100% occlusion, diffuse disease with grade 1 collaterals from LAD septal perforators. S/P  2.5 X 23 mm Xience DES to anamolous LCx  and  3.5 X 18 mm Xience DES to mid LAD w 0% residual stenosis.  Assessment     ICD-10-CM   1. Coronary artery disease involving native coronary artery of native heart with angina pectoris (HCC)  I25.119 Pitavastatin Calcium (LIVALO) 4 MG TABS  2. Mixed hyperlipidemia  E78.2 Pitavastatin Calcium (LIVALO) 4 MG  TABS  3. Essential hypertension  I10     EKG 05/16/2019: Normal sinus rhythm at the rate of 70 bpm, normal axis.  IVCD, Nonspecific T-wave flattening. No significant change from    EKG 06/02/2018.  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   To improve his LDL, I had increased Livalo from 1 mg to 4 mg, which  He is tolerating. He could not tolerate carvedilol 6.25 mg b.i.d. to fatigue and metoprolol caused him diarrhea.  He has not had any angina pectoris, no changes in his EKG.  No change in vascular examination well.  Blood pressure is elevated today at 140 mmHg, was previously well controlled without any beta blockers.  A, patient states that his blood pressure is never greater than 130 mmHg.Hence I did not make any changes.  With regard to his hyperlipidemia, he is tolerating Livalo and Zetia, continue the same.  He did not get his labs checked, will do Monday. He may be a good candidate for addition of an changing Zetia to Nexlizet 600 mg daily.  I will see him back in 6 months.   Adrian Prows, MD, [facility] 07/01/2019, 10:24 AM [facility] [place] Pager: [phone] Office: [phone] If no answer Cell [phone]

## 2019-07-04 NOTE — Telephone Encounter (Signed)
Please read

## 2019-07-05 NOTE — Telephone Encounter (Signed)
He is on [employer], can you see if you are able to contact a [employer] rep

## 2019-07-08 ENCOUNTER — Other Ambulatory Visit: Payer: Self-pay

## 2019-07-08 ENCOUNTER — Ambulatory Visit (INDEPENDENT_AMBULATORY_CARE_PROVIDER_SITE_OTHER): Payer: 59 | Admitting: Pulmonary Disease

## 2019-07-08 ENCOUNTER — Encounter: Payer: Self-pay | Admitting: Pulmonary Disease

## 2019-07-08 VITALS — BP 120/80 | HR 75 | Temp 97.6°F | Ht 68.0 in | Wt 213.8 lb

## 2019-07-08 DIAGNOSIS — J849 Interstitial pulmonary disease, unspecified: Secondary | ICD-10-CM | POA: Diagnosis not present

## 2019-07-08 NOTE — Patient Instructions (Signed)
Your CT scan does not show any evidence of interstitial lung disease which is good news There is a small lung nodule that is benign The condition called at trapping may indicate mild airway inflammation or mild asthma We will schedule you for pulmonary function test Follow-up in 3 to 4 months after completion of tests

## 2019-07-08 NOTE — Progress Notes (Signed)
Peter Blake    [id number]    09-08-1965  Primary Care Physician:Kim, Jeneen Rinks, MD  Referring Physician: [name], MD [place],  [place] [address]  Chief complaint: Consult for abnormal CT, interstitial lung disease  HPI: 53 year old with history of coronary artery disease, hypertension, hyperlipidemia, diabetes Complains of dyspnea on exertion for the past 2 to 3 years.  He has cough, nonproductive in nature which occurs mostly in the morning.  He had chest CT and x-ray in 2015 question of interstitial lung disease and has been referred here for further evaluation  History significant for coronary artery disease s/p stenting in March 2019.  Follows with Dr. 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  His wife brought down pillows and comforters recently in November 2020 Smoking history: Never smoker.  Had smoked crack cocaine and abused alcohol up to 1999 Travel history: Originally from [place].  No significant recent travel Relevant family history: Patient is adopted.  Reports that his adopted father died of mesothelioma from asbestos exposure.  His mother died from unspecified lung cancer and the suspicion that she may have been exposed to asbestos from her husband's clothes.  Interim history: Here for review of CT.  States that breathing is doing well with no issues.  Outpatient Encounter Medications as of 07/08/2019  Medication Sig  . amLODipine (NORVASC) 10 MG tablet Take 10 mg by mouth daily.   Marland Kitchen aspirin EC 81 MG tablet Take 81 mg by mouth daily.  Marland Kitchen ezetimibe (ZETIA) 10 MG tablet TAKE 1 TABLET BY MOUTH EVERY DAY  . hydrochlorothiazide (MICROZIDE) 12.5 MG capsule Take 12.5 mg by mouth daily.  . Insulin Glargine (TOUJEO SOLOSTAR [place]) Inject  into the skin. 15 units at bedtime  . insulin lispro (HUMALOG) 100 UNIT/ML injection Inject 20-30 Units into the skin 3 (three) times daily before meals. Per sliding scale  . losartan (COZAAR) 100 MG tablet Take 100 mg by mouth daily.   . nitroGLYCERIN (NITROSTAT) 0.4 MG SL tablet Place 1 tablet (0.4 mg total) under the tongue every 5 (five) minutes as needed for chest pain.  . Pitavastatin Calcium (LIVALO) 4 MG TABS Take 1 tablet (4 mg total) by mouth daily.  . polyvinyl alcohol (LIQUIFILM TEARS) 1.4 % ophthalmic solution Place 1 drop into both eyes as needed for dry eyes.  . sildenafil (VIAGRA) 100 MG tablet Take 100 mg by mouth as needed for erectile dysfunction.  Marland Kitchen tobramycin (TOBREX) 0.3 % ophthalmic solution Place 1 drop into both eyes as directed. Prior to eye injections   No facility-administered encounter medications on file as of 07/08/2019.   Physical Exam: Blood pressure 120/80, pulse 75, temperature 97.6 F (36.4 C), temperature source Temporal, height 5\' 8"  (1.727 m), weight 213 lb 12.8 oz (97 kg), SpO2 98 %. Gen:      No acute distress HEENT:  EOMI, sclera anicteric Neck:     No masses; no thyromegaly Lungs:    Clear to auscultation bilaterally; normal respiratory effort CV:         Regular rate and rhythm; no murmurs Abd:      + bowel sounds; soft, non-tender; no palpable masses, no distension Ext:    No edema; adequate peripheral perfusion Skin:  Warm and dry; no rash Neuro: alert and oriented x 3 Psych: normal mood and affect  Data Reviewed: Imaging: CTA 03/16/2014-no pulmonary embolism pulmonary vascular congestion, groundglass opacities with prominent interstitial markings.  Moderate hiatal hernia, cholelithiasis.    Chest x-ray 08/23/2018-no acute cardiopulmonary disease.  CT high-resolution 06/29/2019-evidence of ILD, mild air trapping, moderate hiatal hernia.  3 mm nodule in the left lower lobe. I have reviewed the images personally.  Assessment:  Evaluation  for interstitial lung disease Imaging from 2015 with groundglass opacities which may be secondary to vascular congestion in the setting of heart disease.  Follow-up CT chest does not show any evidence of interstitial lung disease  Small pulmonary nodule appears benign.  He does have mild air trapping suggestive of airway inflammation, reactive airway disease Schedule pulmonary function test  Plan/Recommendations: -PFTs  Follow-up in 3 to 4 months  Marshell Garfinkel MD [facility] 07/08/2019, 8:51 AM  CC: [name], MD

## 2019-07-26 ENCOUNTER — Encounter: Payer: Self-pay | Admitting: Gastroenterology

## 2019-08-12 ENCOUNTER — Other Ambulatory Visit: Payer: Self-pay

## 2019-08-12 ENCOUNTER — Ambulatory Visit (AMBULATORY_SURGERY_CENTER): Payer: Self-pay

## 2019-08-12 VITALS — Temp 97.7°F | Ht 68.0 in | Wt 216.8 lb

## 2019-08-12 DIAGNOSIS — Z01818 Encounter for other preprocedural examination: Secondary | ICD-10-CM

## 2019-08-12 DIAGNOSIS — Z1211 Encounter for screening for malignant neoplasm of colon: Secondary | ICD-10-CM

## 2019-08-12 MED ORDER — NA SULFATE-K SULFATE-MG SULF 17.5-3.13-1.6 GM/177ML PO SOLN: 1.0000 | Freq: Once | ORAL | 0 refills | Status: AC

## 2019-08-12 NOTE — Progress Notes (Signed)

## 2019-08-19 ENCOUNTER — Encounter: Payer: Self-pay | Admitting: Gastroenterology

## 2019-08-23 ENCOUNTER — Other Ambulatory Visit: Payer: Self-pay | Admitting: Gastroenterology

## 2019-08-23 ENCOUNTER — Ambulatory Visit (INDEPENDENT_AMBULATORY_CARE_PROVIDER_SITE_OTHER): Payer: 59

## 2019-08-23 ENCOUNTER — Other Ambulatory Visit: Payer: Self-pay

## 2019-08-23 DIAGNOSIS — Z1159 Encounter for screening for other viral diseases: Secondary | ICD-10-CM

## 2019-08-24 LAB — SARS CORONAVIRUS 2 (TAT 6-24 HRS): SARS Coronavirus 2: NEGATIVE

## 2019-08-26 ENCOUNTER — Encounter: Payer: Self-pay | Admitting: Gastroenterology

## 2019-08-26 ENCOUNTER — Ambulatory Visit (AMBULATORY_SURGERY_CENTER): Payer: 59 | Admitting: Gastroenterology

## 2019-08-26 ENCOUNTER — Other Ambulatory Visit: Payer: Self-pay

## 2019-08-26 VITALS — BP 104/71 | HR 79 | Temp 97.3°F | Resp 15

## 2019-08-26 DIAGNOSIS — D122 Benign neoplasm of ascending colon: Secondary | ICD-10-CM | POA: Diagnosis not present

## 2019-08-26 DIAGNOSIS — Z1211 Encounter for screening for malignant neoplasm of colon: Secondary | ICD-10-CM | POA: Diagnosis present

## 2019-08-26 MED ORDER — SODIUM CHLORIDE 0.9 % IV SOLN: 500.0000 mL | INTRAVENOUS | Status: DC

## 2019-08-26 NOTE — Op Note (Signed)
[facility] Patient Name: Peter Blake Procedure Date: 08/26/2019 7:57 AM MRN: [id number] Endoscopist: Milus Banister , MD Age: 54 Referring MD:  Date of Birth: 1966-01-23 Gender: Male Account #: [id number] Procedure:                Colonoscopy Indications:              Screening for colorectal malignant neoplasm Medicines:                Monitored Anesthesia Care Procedure:                Pre-Anesthesia Assessment:                           - Prior to the procedure, a History and Physical                            was performed, and patient medications and                            allergies were reviewed. The patient's tolerance of                            previous anesthesia was also reviewed. The risks                            and benefits of the procedure and the sedation                            options and risks were discussed with the patient.                            All questions were answered, and informed consent                            was obtained. Prior Anticoagulants: The patient has                            taken no previous anticoagulant or antiplatelet                            agents. ASA Grade Assessment: II - A patient with                            mild systemic disease. After reviewing the risks                            and benefits, the patient was deemed in                            satisfactory condition to undergo the procedure.                           After obtaining informed consent, the colonoscope  was passed under direct vision. Throughout the                            procedure, the patient's blood pressure, pulse, and                            oxygen saturations were monitored continuously. The                            Colonoscope was introduced through the anus and                            advanced to the the cecum, identified by                            appendiceal orifice and  ileocecal valve. The                            colonoscopy was performed without difficulty. The                            patient tolerated the procedure well. The quality                            of the bowel preparation was good. The ileocecal                            valve, appendiceal orifice, and rectum were                            photographed. Scope In: 8:00:59 AM Scope Out: 8:15:28 AM Scope Withdrawal Time: 0 hours 12 minutes 1 second  Total Procedure Duration: 0 hours 14 minutes 29 seconds  Findings:                 A 3 mm polyp was found in the ascending colon. The                            polyp was sessile. The polyp was removed with a                            cold snare. Resection and retrieval were complete.                           Multiple small and large-mouthed diverticula were                            found in the left colon.                           The exam was otherwise normal on direct and                            retroflexion views. Complications:  No immediate complications. Estimated blood loss:                            None. Estimated Blood Loss:     Estimated blood loss: none. Impression:               - One 3 mm polyp in the ascending colon, removed                            with a cold snare. Resected and retrieved.                           - Diverticulosis in the left colon.                           - The examination was otherwise normal on direct                            and retroflexion views. Recommendation:           - Patient has a contact number available for                            emergencies. The signs and symptoms of potential                            delayed complications were discussed with the                            patient. Return to normal activities tomorrow.                            Written discharge instructions were provided to the                            patient.                            - Resume previous diet.                           - Continue present medications.                           - Await pathology results. Milus Banister, MD 08/26/2019 8:17:21 AM This report has been signed electronically.

## 2019-08-26 NOTE — Progress Notes (Signed)
To PACU, VSS. Report to [email or web address] 

## 2019-08-26 NOTE — Progress Notes (Signed)
Vitals-DT Temp-JB  Called to room to assist during endoscopic procedure.  Patient ID and intended procedure confirmed with present staff. Received instructions for my participation in the procedure from the performing physician.

## 2019-08-26 NOTE — Progress Notes (Signed)
Called to room to assist during endoscopic procedure.  Patient ID and intended procedure confirmed with present staff. Received instructions for my participation in the procedure from the performing physician.  

## 2019-08-26 NOTE — Patient Instructions (Signed)
YOU HAD AN ENDOSCOPIC PROCEDURE TODAY AT [facility]:   Refer to the procedure report that was given to you for any specific questions about what was found during the examination.  If the procedure report does not answer your questions, please call your gastroenterologist to clarify.  If you requested that your care partner not be given the details of your procedure findings, then the procedure report has been included in a sealed envelope for you to review at your convenience later.  **Handouts given on Polyps and Diverticulosis**  YOU SHOULD EXPECT: Some feelings of bloating in the abdomen. Passage of more gas than usual.  Walking can help get rid of the air that was put into your GI tract during the procedure and reduce the bloating. If you had a lower endoscopy (such as a colonoscopy or flexible sigmoidoscopy) you may notice spotting of blood in your stool or on the toilet paper. If you underwent a bowel prep for your procedure, you may not have a normal bowel movement for a few days.  Please Note:  You might notice some irritation and congestion in your nose or some drainage.  This is from the oxygen used during your procedure.  There is no need for concern and it should clear up in a day or so.  SYMPTOMS TO REPORT IMMEDIATELY:   Following lower endoscopy (colonoscopy or flexible sigmoidoscopy):  Excessive amounts of blood in the stool  Significant tenderness or worsening of abdominal pains  Swelling of the abdomen that is new, acute  Fever of 100F or higher   For urgent or emergent issues, a gastroenterologist can be reached at any hour by calling [phone].   DIET:  We do recommend a small meal at first, but then you may proceed to your regular diet.  Drink plenty of fluids but you should avoid alcoholic beverages for 24 hours.  ACTIVITY:  You should plan to take it easy for the rest of today and you should NOT DRIVE or use heavy machinery until tomorrow (because  of the sedation medicines used during the test).    FOLLOW UP: Our staff will call the number listed on your records 48-72 hours following your procedure to check on you and address any questions or concerns that you may have regarding the information given to you following your procedure. If we do not reach you, we will leave a message.  We will attempt to reach you two times.  During this call, we will ask if you have developed any symptoms of COVID 19. If you develop any symptoms (ie: fever, flu-like symptoms, shortness of breath, cough etc.) before then, please call [phone].  If you test positive for Covid 19 in the 2 weeks post procedure, please call and report this information to [place].    If any biopsies were taken you will be contacted by phone or by letter within the next 1-3 weeks.  Please call us at [phone] if you have not heard about the biopsies in 3 weeks.    SIGNATURES/CONFIDENTIALITY: You and/or your care partner have signed paperwork which will be entered into your electronic medical record.  These signatures attest to the fact that that the information above on your After Visit Summary has been reviewed and is understood.  Full responsibility of the confidentiality of this discharge information lies with you and/or your care-partner.

## 2019-08-27 ENCOUNTER — Other Ambulatory Visit (HOSPITAL_COMMUNITY)
Admission: RE | Admit: 2019-08-27 | Discharge: 2019-08-27 | Disposition: A | Discharge status: Home / Self Care | Payer: 59 | Source: Ambulatory Visit | Attending: Pulmonary Disease | Admitting: Pulmonary Disease

## 2019-08-27 DIAGNOSIS — Z20822 Contact with and (suspected) exposure to covid-19: Secondary | ICD-10-CM | POA: Diagnosis not present

## 2019-08-27 DIAGNOSIS — Z01812 Encounter for preprocedural laboratory examination: Secondary | ICD-10-CM | POA: Insufficient documentation

## 2019-08-27 LAB — SARS CORONAVIRUS 2 (TAT 6-24 HRS): SARS Coronavirus 2: NEGATIVE

## 2019-08-30 ENCOUNTER — Telehealth: Payer: Self-pay | Admitting: *Deleted

## 2019-08-30 NOTE — Telephone Encounter (Signed)
Attempted f/u phone call. No answer. Left message. °

## 2019-08-30 NOTE — Telephone Encounter (Signed)
  Follow up Call-  Call back number 08/26/2019  Post procedure Call Back phone  # [phone]  Permission to leave phone message Yes  Some recent data might be hidden     Patient questions:  Do you have a fever, pain , or abdominal swelling? No. Pain Score  0 *  Have you tolerated food without any problems? Yes.    Have you been able to return to your normal activities? Yes.    Do you have any questions about your discharge instructions: Diet   No. Medications  No. Follow up visit  No.  Do you have questions or concerns about your Care? No.  Actions: * If pain score is 4 or above: 1. No action needed, pain <4.Have you developed a fever since your procedure? no  2.   Have you had an respiratory symptoms (SOB or cough) since your procedure? no  3.   Have you tested positive for COVID 19 since your procedure no  4.   Have you had any family members/close contacts diagnosed with the COVID 19 since your procedure?  no   If yes to any of these questions please route to Joylene John, RN and Alphonsa Gin, Therapist, sports.

## 2019-08-31 ENCOUNTER — Encounter: Payer: Self-pay | Admitting: Gastroenterology

## 2019-08-31 ENCOUNTER — Other Ambulatory Visit: Payer: Self-pay

## 2019-08-31 ENCOUNTER — Ambulatory Visit (INDEPENDENT_AMBULATORY_CARE_PROVIDER_SITE_OTHER): Payer: 59 | Admitting: Pulmonary Disease

## 2019-08-31 DIAGNOSIS — J849 Interstitial pulmonary disease, unspecified: Secondary | ICD-10-CM | POA: Diagnosis not present

## 2019-08-31 LAB — PULMONARY FUNCTION TEST
DL/VA % pred: 99 %
DL/VA: 4.36 ml/min/mmHg/L
DLCO unc % pred: 73 %
DLCO unc: 20.54 ml/min/mmHg
FEF 25-75 Post: 5.24 L/sec
FEF 25-75 Pre: 5.23 L/sec
FEF2575-%Change-Post: 0 %
FEF2575-%Pred-Post: 164 %
FEF2575-%Pred-Pre: 164 %
FEV1-%Change-Post: -1 %
FEV1-%Pred-Post: 79 %
FEV1-%Pred-Pre: 80 %
FEV1-Post: 2.91 L
FEV1-Pre: 2.96 L
FEV1FVC-%Change-Post: 0 %
FEV1FVC-%Pred-Pre: 116 %
FEV6-%Change-Post: 0 %
FEV6-%Pred-Post: 71 %
FEV6-%Pred-Pre: 71 %
FEV6-Post: 3.27 L
FEV6-Pre: 3.3 L
FEV6FVC-%Change-Post: 0 %
FEV6FVC-%Pred-Post: 104 %
FEV6FVC-%Pred-Pre: 104 %
FVC-%Change-Post: 0 %
FVC-%Pred-Post: 68 %
FVC-%Pred-Pre: 68 %
FVC-Post: 3.27 L
FVC-Pre: 3.3 L
Post FEV1/FVC ratio: 89 %
Post FEV6/FVC ratio: 100 %
Pre FEV1/FVC ratio: 90 %
Pre FEV6/FVC Ratio: 100 %
RV % pred: 79 %
RV: 1.64 L
TLC % pred: 75 %
TLC: 5.15 L

## 2019-08-31 NOTE — Progress Notes (Signed)
PFT done today. 

## 2019-08-31 NOTE — Telephone Encounter (Signed)
Opened in error

## 2019-09-13 ENCOUNTER — Telehealth: Payer: Self-pay | Admitting: Pulmonary Disease

## 2019-09-13 NOTE — Telephone Encounter (Signed)
Called and spoke with Patient. Patient is requesting PFT results from 08/31/19. Patient does not have follow up with Dr.Mannam until 10/14/19.  Message routed to Dr. Vaughan Browner

## 2019-09-13 NOTE — Telephone Encounter (Signed)
Attempted to call pt but line went straight to VM. Left message for pt to return call. 

## 2019-09-13 NOTE — Telephone Encounter (Signed)
PFTs show minimal reduction in lung capacity. It is not very significant and may be from his body habitus. No change in therapy needed and I will discuss further at clinic visit.

## 2019-09-13 NOTE — Telephone Encounter (Signed)
Spoke with pt and notified of results per Dr. Mannam.  Pt verbalized understanding and denied any questions. 

## 2019-10-06 ENCOUNTER — Ambulatory Visit: Payer: 59 | Admitting: Pulmonary Disease

## 2019-10-14 ENCOUNTER — Ambulatory Visit: Payer: 59 | Admitting: Pulmonary Disease

## 2019-12-30 ENCOUNTER — Ambulatory Visit: Payer: 59 | Admitting: Cardiology

## 2020-01-02 ENCOUNTER — Ambulatory Visit: Payer: 59 | Admitting: Cardiology

## 2020-01-30 ENCOUNTER — Ambulatory Visit: Payer: 59 | Admitting: Cardiology

## 2020-01-30 ENCOUNTER — Other Ambulatory Visit: Payer: Self-pay

## 2020-01-30 ENCOUNTER — Encounter: Payer: Self-pay | Admitting: Cardiology

## 2020-01-30 VITALS — BP 134/84 | HR 76 | Resp 16 | Ht 68.0 in | Wt 218.0 lb

## 2020-01-30 DIAGNOSIS — E782 Mixed hyperlipidemia: Secondary | ICD-10-CM

## 2020-01-30 DIAGNOSIS — I10 Essential (primary) hypertension: Secondary | ICD-10-CM

## 2020-01-30 DIAGNOSIS — I25119 Atherosclerotic heart disease of native coronary artery with unspecified angina pectoris: Secondary | ICD-10-CM

## 2020-01-30 NOTE — Progress Notes (Signed)
Primary Physician/Referring:  Peter Gravel, MD  Patient ID: Peter Blake, male    DOB: December 31, 1965, 54 y.o.   MRN: [id number]  Chief Complaint  Patient presents with  . Follow-up    6 month  . Coronary Artery Disease  . Hyperlipidemia   HPI:    Peter Blake  is a 55 y.o. 54 y/o Caucasian male with CAD s/p PCI to anomaloous LCx and mid LAD (09/2017), hypertension, hyperlipidemia, statin intolerance, uncontrolled type 1 DM, erectile dysfunction, interstitial lung disease presents here for annual visit.  He is presently tolerating Livalo and Zetia. He now has developed worsening diabetic retinopathy and also has peripheral neuropathy.  This is a 53-monthoffice visit, fortunately he has not had any chest pain or dyspnea on exertion.  States that his diabetes is improving.  Past Medical History:  Diagnosis Date  . Coronary artery disease   . Diabetes mellitus without complication ([place] [id number]  Type I   . GERD (gastroesophageal reflux disease)   . Hyperlipidemia   . Hypertension   . Peripheral vascular disease ([facility]    Past Surgical History:  Procedure Laterality Date  . CHOLECYSTECTOMY N/A 03/16/2014   Procedure: LAPAROSCOPIC CHOLECYSTECTOMY WITH INTRAOPERATIVE CHOLANGIOGRAM;  Surgeon: HAdin Hector MD;  Location: [facility];  Service: General;  Laterality: N/A;  . CORONARY STENT INTERVENTION N/A 09/29/2017   Procedure: CORONARY STENT INTERVENTION;  Surgeon: PNigel Mormon MD;  [facility];  Service: Cardiovascular;  Laterality: N/A;  . CORONARY STENT PLACEMENT  2019   2 stents  . LEFT HEART CATH AND CORONARY ANGIOGRAPHY N/A 09/22/2017   Procedure: LEFT HEART CATH AND CORONARY ANGIOGRAPHY;  Surgeon: PNigel Mormon MD;  [facility];  Service: Cardiovascular;  Laterality: N/A;  . LOWER EXTREMITY ANGIOGRAPHY N/A 09/22/2017   Procedure: LOWER EXTREMITY ANGIOGRAPHY;  Surgeon: PNigel Mormon MD;  [facility];   Service: Cardiovascular;  Laterality: N/A;   Social History   Tobacco Use  . Smoking status: Never Smoker  . Smokeless tobacco: Never Used  Substance Use Topics  . Alcohol use: No   Marital Status: Married   ROS  Review of Systems  Cardiovascular: Positive for leg swelling (minimal). Negative for chest pain and dyspnea on exertion.  Respiratory: Positive for snoring.   Gastrointestinal: Negative for melena.   Objective   Vitals with BMI 01/30/2020 08/26/2019 08/26/2019  Height 5' 8"  - -  Weight 218 lbs - -  BMI 359.16- -  Systolic [id number]  Diastolic 84 71 64  Pulse 76 79 77    Blood pressure 134/84, pulse 76, resp. rate 16, height 5' 8"  (1.727 m), weight 218 lb (98.9 kg), SpO2 96 %. Body mass index is 33.15 kg/m.   Physical Exam Neck:     Thyroid: No thyromegaly.     Vascular: No JVD.  Cardiovascular:     Rate and Rhythm: Normal rate and regular rhythm.     Pulses:          Carotid pulses are 2+ on the right side and 2+ on the left side.      Radial pulses are 2+ on the right side and 2+ on the left side.       Femoral pulses are 2+ on the right side and 2+ on the left side.      Popliteal pulses are 2+ on the right side and 2+ on the left side.  Dorsalis pedis pulses are 0 on the right side and 0 on the left side.       Posterior tibial pulses are 2+ on the right side and 2+ on the left side.     Heart sounds: Normal heart sounds. No murmur heard.  No gallop.      Comments: Bilateral leg capillary refill is normal.  Trace bilateral leg edema.  No JVD.  Pulmonary:     Effort: Pulmonary effort is normal.     Breath sounds: Normal breath sounds.  Abdominal:     General: Bowel sounds are normal.     Palpations: Abdomen is soft.    Laboratory examination:   No results for input(s): NA, K, CL, CO2, GLUCOSE, BUN, CREATININE, CALCIUM, GFRNONAA, GFRAA in the last 8760 hours. CMP Latest Ref Rng & Units 08/23/2018 09/30/2017 09/30/2017  Glucose 70 - 99 mg/dL 122(H)  208(H) 131(H)  BUN 6 - 20 mg/dL 20 17 19   Creatinine 0.61 - 1.24 mg/dL 1.28(H) 1.08 1.11  Sodium 135 - 145 mmol/L 138 136 137  Potassium 3.5 - 5.1 mmol/L 4.1 3.9 4.0  Chloride 98 - 111 mmol/L 104 104 104  CO2 22 - 32 mmol/L 27 25 24   Calcium 8.9 - 10.3 mg/dL 9.8 8.7(L) 8.8(L)  Total Protein 6.0 - 8.3 g/dL - - -  Total Bilirubin 0.3 - 1.2 mg/dL - - -  Alkaline Phos 39 - 117 U/L - - -  AST 0 - 37 U/L - - -  ALT 0 - 53 U/L - - -   CBC Latest Ref Rng & Units 08/23/2018 09/30/2017 09/29/2017  WBC 4.0 - 10.5 K/uL 9.0 9.2 8.6  Hemoglobin 13.0 - 17.0 g/dL 14.9 12.9(L) 13.6  Hematocrit 39 - 52 % 44.7 37.8(L) 39.1  Platelets 150 - 400 K/uL 286 289 288   Lipid Panel  No results found for: CHOL, TRIG, HDL, CHOLHDL, VLDL, LDLCALC, LDLDIRECT HEMOGLOBIN A1C Lab Results  Component Value Date   HGBA1C 9.3 (H) 03/17/2014   MPG 220 (H) 03/17/2014   External Labs:  Cholesterol, total 128.000 M 11/02/2019 HDL 28.000 M 04/26/2019 LDL-C 79.000 M 10/01/2018 Triglycerides 145.000 11/02/2019  A1C 8.500 % 04/26/2019  TSH 2.890 10/01/2018  Hemoglobin 14.900 08/23/2018 Platelets 286.000 08/23/2018  Creatinine, Serum 1.260 MG/ 11/02/2019 Potassium 4.100 08/23/2018 ALT (SGPT) 34.000 IU/ 11/02/2019  Labs 04/26/2019: Serum glucose 176 mg, potassium 4.9, BUN 19, creatinine 1.19, eGFR 69/80 ML.  Total cholesterol 139, triglycerides 137, HDL 28, LDL 86.  Medications and allergies   Allergies  Allergen Reactions  . Coreg [Carvedilol] Other (See Comments)    Fatigue  . Augmentin [Amoxicillin-Pot Clavulanate] Nausea And Vomiting    Did it involve swelling of the face/tongue/throat, SOB, or low BP? no Did it involve sudden or severe rash/hives, skin peeling, or any reaction on the inside of your mouth or nose? no Did you need to seek medical attention at a hospital or doctor's office? no When did it last happen?2015 If all above answers are "NO", may proceed with cephalosporin use.   . Metformin And Related  Diarrhea  . Metoprolol Other (See Comments)    Diarrhoea  . Invokana [Canagliflozin] Rash     Current Outpatient Medications  Medication Instructions  . amLODipine (NORVASC) 10 mg, Oral, Daily  . aspirin EC 81 mg, Oral, Daily  . Continuous Blood Gluc Sensor (FREESTYLE LIBRE 2 SENSOR SYSTM) MISC USE AS DIRECTED CHANGING EVERY 14 DAY  . ezetimibe (ZETIA) 10 MG tablet TAKE 1 TABLET BY  MOUTH EVERY DAY  . hydrochlorothiazide (MICROZIDE) 12.5 mg, Oral, Daily  . Insulin Glargine (TOUJEO SOLOSTAR [place]) Subcutaneous, 15 units at bedtime  . insulin lispro (HUMALOG) 20-30 Units, Subcutaneous, 3 times daily before meals, Per sliding scale  . Livalo 4 mg, Oral, Daily  . losartan (COZAAR) 100 mg, Daily  . nitroGLYCERIN (NITROSTAT) 0.4 mg, Sublingual, Every 5 min PRN  . polyvinyl alcohol (LIQUIFILM TEARS) 1.4 % ophthalmic solution 1 drop, Both Eyes, As needed  . tobramycin (TOBREX) 0.3 % ophthalmic solution 1 drop, Both Eyes, As directed, Prior to eye injections   Radiology:  No results found. Cardiac Studies:   Echocardiogram 09/02/2017: Left ventricle cavity is normal in size. Moderate concentric hypertrophy of the left ventricle. Normal global wall motion. Doppler evidence of grade I (impaired) diastolic dysfunction, normal LAP. Calculated EF 55%. Mild to moderate mitral regurgitation. Inadequate tricuspid regurgitation jet to estimate pulmonary artery pressure. Normal right atrial pressure.  Abdominal angiogram 09/22/2017:  Abdominal aorta distal 30%. No significant PAD. Slow flow. No Iliac artery disease.   Coronary angiogram 09/29/2017:  Severe multivessel CAD: LAD: Mid LAD 80% stenosis, Anomalous LCx: Prox 80% stenosis RCA on angiogram 09/22/16, PL branch 100% occlusion, diffuse disease with grade 1 collaterals from LAD septal perforators. S/P  2.5 X 23 mm Xience DES to anamolous LCx  and  3.5 X 18 mm Xience DES to mid LAD w 0% residual stenosis.  EKG:  EKG 01/30/2020: Normal sinus rhythm  rate of 69 bpm, normal axis, right bundle branch block.  Nonspecific T abnormality, cannot exclude inferior and lateral ischemia.  Compared to 05/16/2019, right bundle branch block is new.   Assessment     ICD-10-CM   1. Coronary artery disease involving native coronary artery of native heart with angina pectoris ([place])  I25.119 EKG 12-Lead  2. Mixed hyperlipidemia  E78.2   3. Essential hypertension  I10     Recommendations:   ULYSEES ROBARTS  is a 54 y.o. Caucasian male with CAD s/p PCI to anomaloous LCx and mid LAD (09/2017), hypertension, hyperlipidemia, statin intolerance, uncontrolled type 1 DM, erectile dysfunction, interstitial lung disease presents here for annual visit.  He is presently tolerating Livalo and Zetia. He now has developed worsening diabetic retinopathy and also has peripheral neuropathy.  He is presently doing well, blood pressures well controlled, reviewed his external labs, lipids are also under good control.  He has not had any recurrence of angina pectoris and has not used any sublingual nitroglycerin in the recent past.  Control of diabetes mellitus, regular exercise and weight loss was discussed with the patient.  I will see him back in 1 year or sooner if problems.   Adrian Prows, MD, [facility] 01/30/2020, 10:29 PM Office: [phone]

## 2020-02-29 IMAGING — CT CT CHEST HIGH RESOLUTION W/O CM
2 of 5 series · 15 of 36 positions shown, 18 images · non-contrast
Comparison: CT chest angiogram, 03/16/2014

CLINICAL DATA: Interstitial lung disease

EXAM:
CT CHEST WITHOUT CONTRAST
TECHNIQUE: Multidetector CT imaging of the chest was performed following the
standard protocol without intravenous contrast. High resolution
imaging of the lungs, as well as inspiratory and expiratory imaging,
was performed.

[Series 2: high resolution · axial · 0.75mm/px · z∈[-355,-63]mm · 12 of 162 slices shown, 15 images]
[im 8/162  mediastinal]
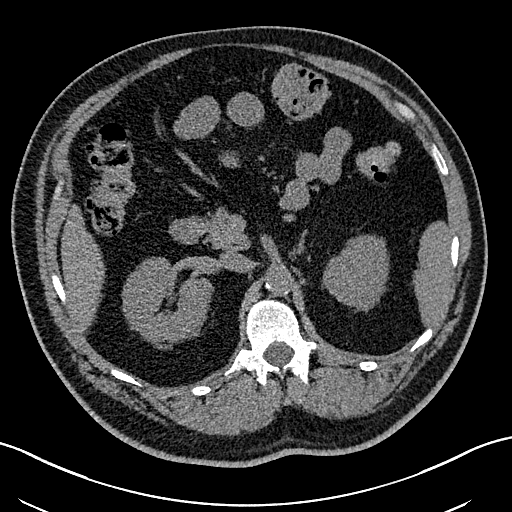
[im 8/162  lung]
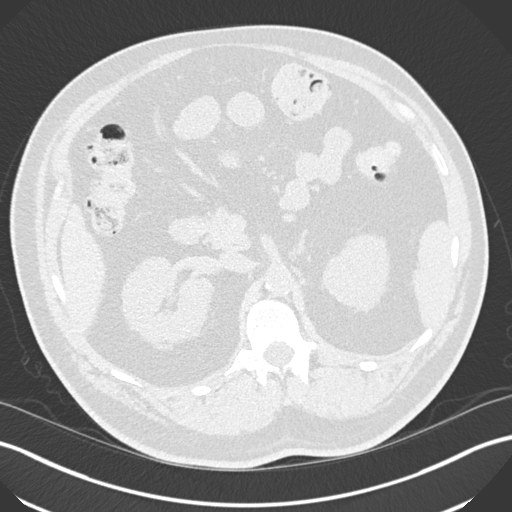
[im 22/162  lung]
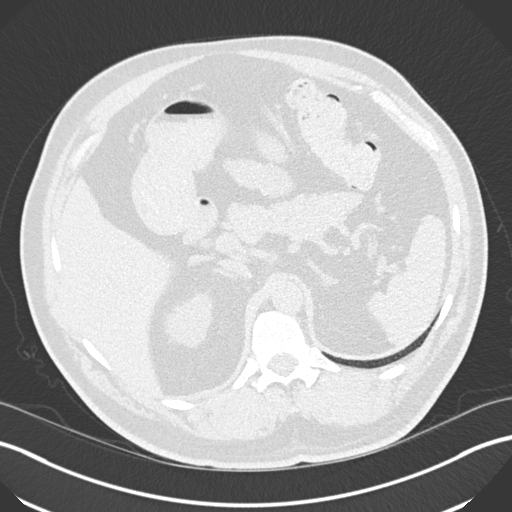
[im 37/162  lung]
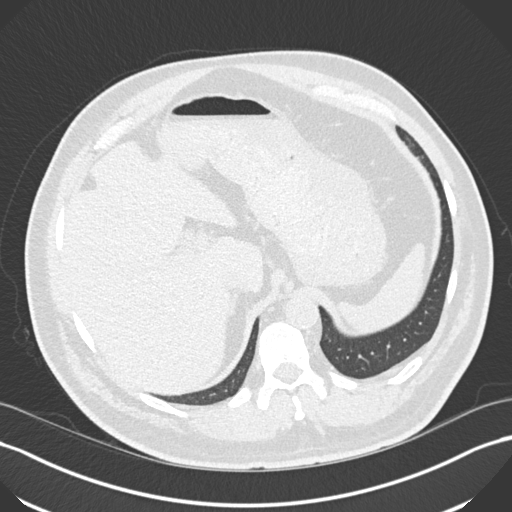
[im 52/162  lung]
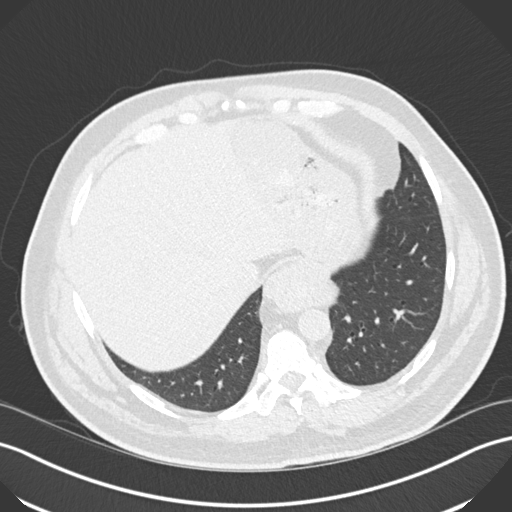
[im 59/162  mediastinal]
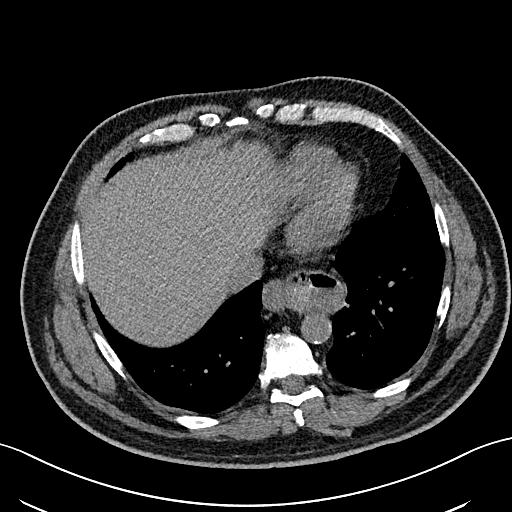
[im 59/162  lung]
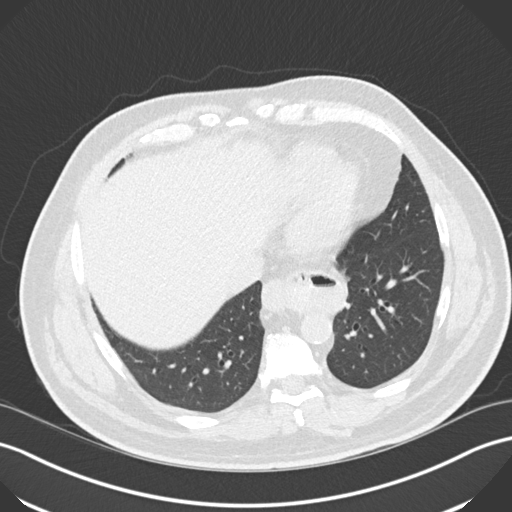
[im 74/162  lung]
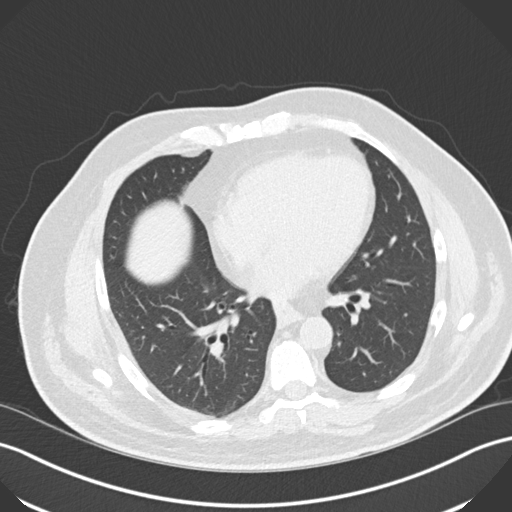
[im 88/162  lung]
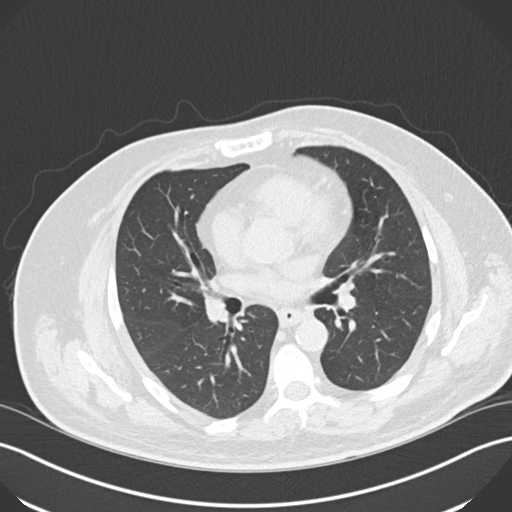
[im 103/162  lung]
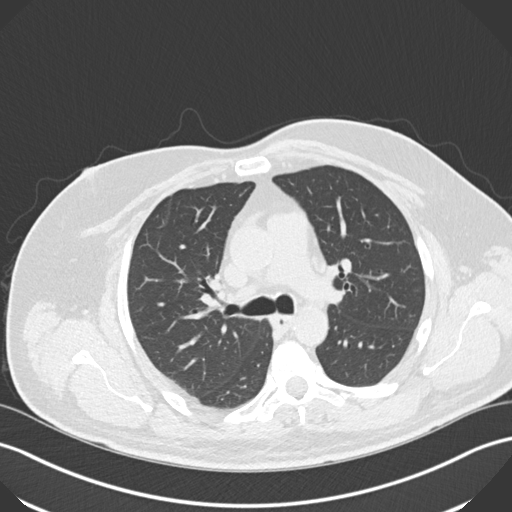
[im 110/162  mediastinal]
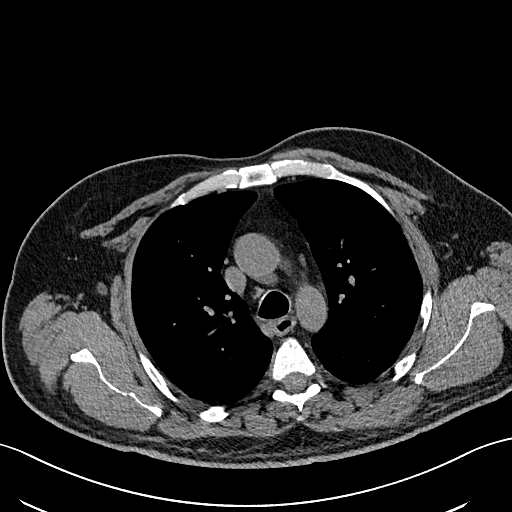
[im 110/162  lung]
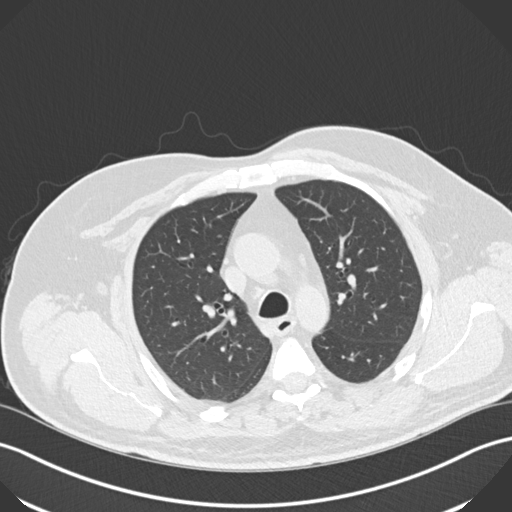
[im 125/162  lung]
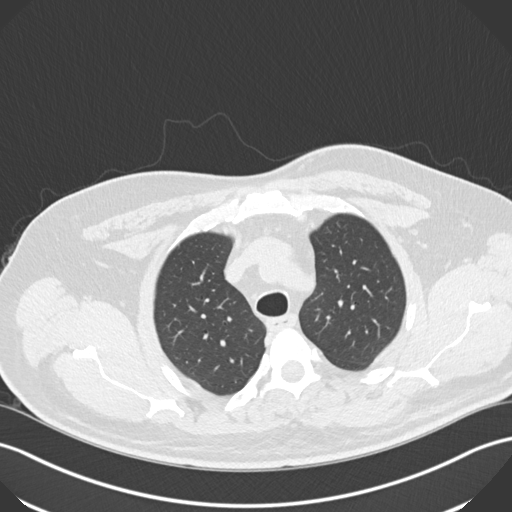
[im 140/162  lung]
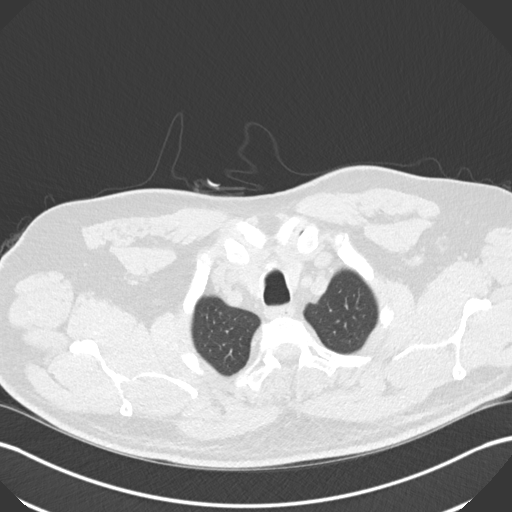
[im 154/162  lung]
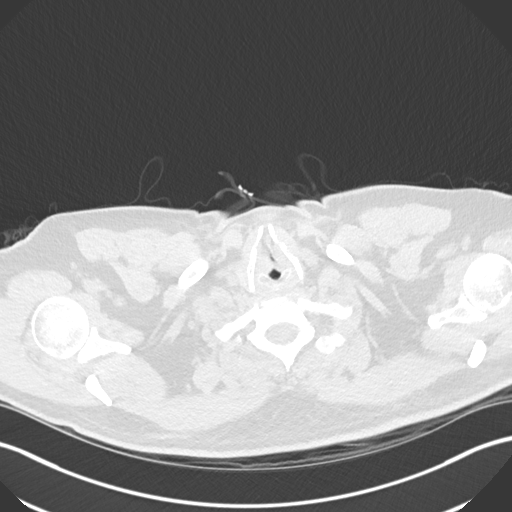

[Series 6: coronal · coronal · 0.65mm/px · 3 of 145 slices shown]
[im 29/145  lung]
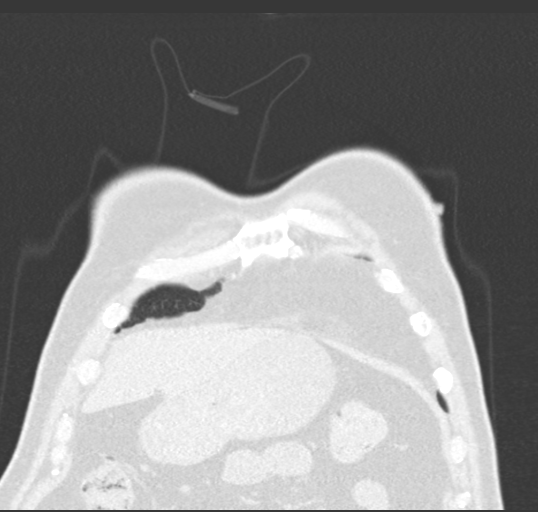
[im 58/145  lung]
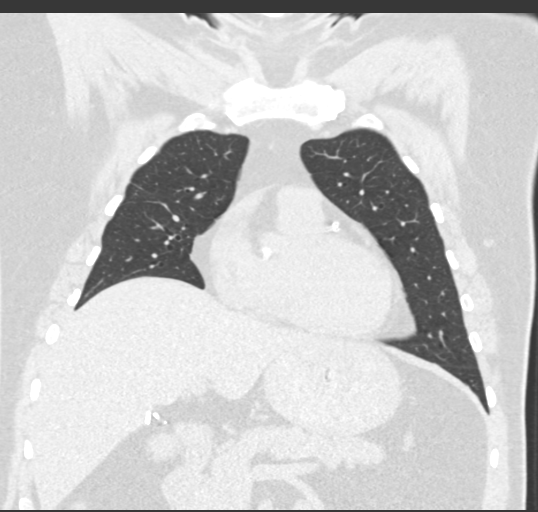
[im 87/145  lung]
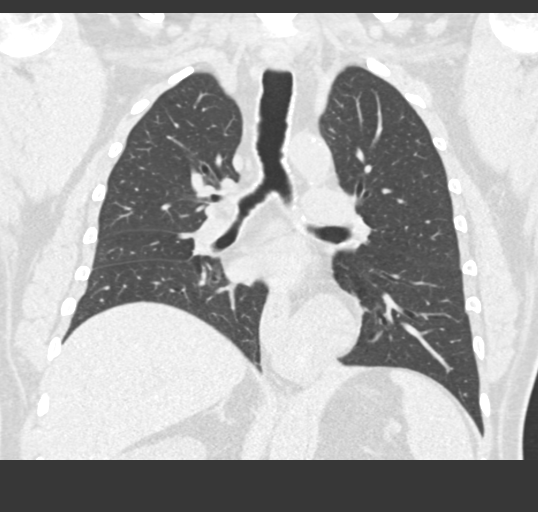

[15 of 36 positions shown; findings below may reference images not displayed]

FINDINGS: Cardiovascular: Scattered aortic atherosclerosis. Coronary artery
calcifications and/or stents. Normal heart size. No pericardial
effusion.

Mediastinum/Nodes: No enlarged mediastinal, hilar, or axillary lymph
nodes. Moderate hiatal hernia. Thyroid gland, trachea, and esophagus
demonstrate no significant findings.

Lungs/Pleura: Lungs are clear. Stable, benign 3 mm pulmonary nodule
of the left lower lobe (series 4, image 176). Mild lobular air
trapping. No pleural effusion or pneumothorax.

Upper Abdomen: No acute abnormality.

Musculoskeletal: No chest wall mass or suspicious bone lesions
identified.
IMPRESSION: 1. No evidence of fibrotic interstitial lung disease.
2. Mild lobular air trapping, suggestive of small airways disease.
3. Moderate hiatal hernia.
4. Coronary artery disease.  Aortic Atherosclerosis ([id number]).

## 2020-05-14 ENCOUNTER — Ambulatory Visit
Admission: RE | Admit: 2020-05-14 | Discharge: 2020-05-14 | Disposition: A | Discharge status: Home / Self Care | Payer: 59 | Source: Ambulatory Visit | Attending: Physician Assistant | Admitting: Physician Assistant

## 2020-05-14 ENCOUNTER — Other Ambulatory Visit: Payer: Self-pay

## 2020-05-14 VITALS — BP 171/80 | HR 98 | Temp 98.2°F | Resp 20

## 2020-05-14 DIAGNOSIS — J0140 Acute pansinusitis, unspecified: Secondary | ICD-10-CM

## 2020-05-14 MED ORDER — DOXYCYCLINE HYCLATE 100 MG PO CAPS: 100.0000 mg | ORAL_CAPSULE | Freq: Two times a day (BID) | ORAL | 0 refills | Status: DC

## 2020-05-14 MED ORDER — BENZONATATE 200 MG PO CAPS: 200.0000 mg | ORAL_CAPSULE | Freq: Three times a day (TID) | ORAL | 0 refills | Status: DC

## 2020-05-14 MED ORDER — IPRATROPIUM BROMIDE 0.06 % NA SOLN: 2.0000 | Freq: Four times a day (QID) | NASAL | 0 refills | Status: DC

## 2020-05-14 NOTE — Discharge Instructions (Signed)
Doxycycline for sinus infection. Tessalon for cough. Start atrovent nasal spray for nasal congestion/drainage. You can use over the counter nasal saline rinse such as neti pot for nasal congestion. Keep hydrated, your urine should be clear to pale yellow in color. Tylenol/motrin for fever and pain. Monitor for any worsening of symptoms, chest pain, shortness of breath, wheezing, swelling of the throat, go to the emergency department for further evaluation needed.

## 2020-05-14 NOTE — ED Provider Notes (Signed)
[facility]    CSN: [id number] Arrival date & time: 05/14/20  1742      History   Chief Complaint Chief Complaint  Patient presents with  . Nasal Congestion    HPI Peter Blake is a 54 y.o. male.   53 year old male with history of CAD, DM, HTN, HLD coems in for 4 week history of nasal congestion, sinus pressure, coughing, post nasal drip. Denies fever, chills, body aches. Denies abdominal pain, nausea, vomiting, diarrhea. Denies shortness of breath, loss of taste/smell. otc medicine without relief.      Past Medical History:  Diagnosis Date  . Coronary artery disease   . Diabetes mellitus without complication ([place]) 6606   Type I   . GERD (gastroesophageal reflux disease)   . Hyperlipidemia   . Hypertension   . Peripheral vascular disease [facility])     Patient Active Problem List   Diagnosis Date Noted  . Post PTCA 09/29/2017  . Coronary artery disease with angina pectoris ([place]) 09/27/2017  . Claudication in peripheral vascular disease ([place]) 09/20/2017  . Cholelithiasis 03/16/2014  . Cholecystitis, acute with cholelithiasis 03/16/2014  . COUGH DUE TO ACE INHIBITORS 08/13/2009  . DIABETES MELLITUS, TYPE I 11/12/2007  . HYPERCHOLESTEROLEMIA 10/11/2007  . HYPERTENSION 10/11/2007  . NUMBNESS 10/11/2007    Past Surgical History:  Procedure Laterality Date  . CHOLECYSTECTOMY N/A 03/16/2014   Procedure: LAPAROSCOPIC CHOLECYSTECTOMY WITH INTRAOPERATIVE CHOLANGIOGRAM;  Surgeon: [name], MD;  Location: [facility];  Service: General;  Laterality: N/A;  . CORONARY STENT INTERVENTION N/A 09/29/2017   Procedure: CORONARY STENT INTERVENTION;  Surgeon: Nigel Mormon, MD;  Location: [place] CV LAB;  Service: Cardiovascular;  Laterality: N/A;  . CORONARY STENT PLACEMENT  2019   2 stents  . LEFT HEART CATH AND CORONARY ANGIOGRAPHY N/A 09/22/2017   Procedure: LEFT HEART CATH AND CORONARY ANGIOGRAPHY;  Surgeon: Nigel Mormon, MD;  Location: [place] CV LAB;  Service: Cardiovascular;  Laterality: N/A;  . LOWER EXTREMITY ANGIOGRAPHY N/A 09/22/2017   Procedure: LOWER EXTREMITY ANGIOGRAPHY;  Surgeon: Nigel Mormon, MD;  Location: [place] CV LAB;  Service: Cardiovascular;  Laterality: N/A;       Home Medications    Prior to Admission medications   Medication Sig Start Date End Date Taking? Authorizing Provider  amLODipine (NORVASC) 10 MG tablet Take 10 mg by mouth daily.     [provider]  aspirin EC 81 MG tablet Take 81 mg by mouth daily.    [provider]  benzonatate (TESSALON) 200 MG capsule Take 1 capsule (200 mg total) by mouth every 8 (eight) hours. 05/14/20   Tasia Catchings, Lonni Dirden V, PA-C  Continuous Blood Gluc Sensor (FREESTYLE LIBRE 2 SENSOR SYSTM) MISC USE AS DIRECTED CHANGING EVERY 14 DAY 08/02/19   [provider]  doxycycline (VIBRAMYCIN) 100 MG capsule Take 1 capsule (100 mg total) by mouth 2 (two) times daily. 05/14/20   Tasia Catchings, Caylan Chenard V, PA-C  ezetimibe (ZETIA) 10 MG tablet TAKE 1 TABLET BY MOUTH EVERY DAY 04/13/19   Adrian Prows, MD  hydrochlorothiazide (MICROZIDE) 12.5 MG capsule Take 12.5 mg by mouth daily.    [provider]  Insulin Glargine (TOUJEO SOLOSTAR [place]) Inject into the skin. 15 units at bedtime    [provider]  insulin lispro (HUMALOG) 100 UNIT/ML injection Inject 20-30 Units into the skin 3 (three) times daily before meals. Per sliding scale    [provider]  ipratropium (ATROVENT) 0.06 %  nasal spray Place 2 sprays into both nostrils 4 (four) times daily. 05/14/20   Tasia Catchings, Chey Cho V, PA-C  losartan (COZAAR) 100 MG tablet Take 100 mg by mouth daily.     [provider]  nitroGLYCERIN (NITROSTAT) 0.4 MG SL tablet Place 1 tablet (0.4 mg total) under the tongue every 5 (five) minutes as needed for chest pain. 10/19/18 01/30/20  Adrian Prows, MD  Pitavastatin Calcium (LIVALO) 4 MG TABS Take 1 tablet (4 mg total) by mouth daily. 07/01/19   Adrian Prows, MD    polyvinyl alcohol (LIQUIFILM TEARS) 1.4 % ophthalmic solution Place 1 drop into both eyes as needed for dry eyes.    [provider]  tobramycin (TOBREX) 0.3 % ophthalmic solution Place 1 drop into both eyes as directed. Prior to eye injections 08/03/18   [provider]    Family History Family History  Adopted: Yes  Problem Relation Age of Onset  . Cancer Mother   . Heart attack Mother   . Lung cancer Father   . Colon cancer Neg Hx     Social History Social History   Tobacco Use  . Smoking status: Never Smoker  . Smokeless tobacco: Never Used  Vaping Use  . Vaping Use: Never used  Substance Use Topics  . Alcohol use: No  . Drug use: Not Currently     Allergies   Coreg [carvedilol], Augmentin [amoxicillin-pot clavulanate], Metformin and related, Metoprolol, and Invokana [canagliflozin]   Review of Systems Review of Systems  Reason unable to perform ROS: See HPI as above.     Physical Exam Triage Vital Signs ED Triage Vitals [05/14/20 1841]  Enc Vitals Group     BP (!) 171/80     Pulse Rate 98     Resp 20     Temp 98.2 F (36.8 C)     Temp Source Oral     SpO2 95 %     Weight      Height      Head Circumference      Peak Flow      Pain Score      Pain Loc      Pain Edu?      Excl. in [place]?    No data found.  Updated Vital Signs BP (!) 171/80 (BP Location: Left Arm)   Pulse 98   Temp 98.2 F (36.8 C) (Oral)   Resp 20   SpO2 95%   Physical Exam Constitutional:      General: He is not in acute distress.    Appearance: He is well-developed. He is not ill-appearing, toxic-appearing or diaphoretic.  HENT:     Head: Normocephalic and atraumatic.     Right Ear: Tympanic membrane, ear canal and external ear normal. Tympanic membrane is not erythematous or bulging.     Left Ear: Tympanic membrane, ear canal and external ear normal. Tympanic membrane is not erythematous or bulging.     Nose:     Right Sinus: Maxillary sinus tenderness  and frontal sinus tenderness present.     Left Sinus: Maxillary sinus tenderness and frontal sinus tenderness present.     Mouth/Throat:     Mouth: Mucous membranes are moist.     Pharynx: Oropharynx is clear. Uvula midline.  Eyes:     Conjunctiva/sclera: Conjunctivae normal.     Pupils: Pupils are equal, round, and reactive to light.  Cardiovascular:     Rate and Rhythm: Normal rate and regular rhythm.  Pulmonary:  Effort: Pulmonary effort is normal. No accessory muscle usage, prolonged expiration, respiratory distress or retractions.     Breath sounds: No decreased air movement or transmitted upper airway sounds. No decreased breath sounds.     Comments: LCTAB Musculoskeletal:     Cervical back: Normal range of motion and neck supple.  Skin:    General: Skin is warm and dry.  Neurological:     Mental Status: He is alert and oriented to person, place, and time.      UC Treatments / Results  Labs (all labs ordered are listed, but only abnormal results are displayed) Labs Reviewed - No data to display  EKG   Radiology No results found.  Procedures Procedures (including critical care time)  Medications Ordered in UC Medications - No data to display  Initial Impression / Assessment and Plan / UC Course  I have reviewed the triage vital signs and the nursing notes.  Pertinent labs & imaging results that were available during my care of the patient were reviewed by me and considered in my medical decision making (see chart for details).    Doxycycline for sinusitis. Other symptomatic treatment discussed. Return precautions given.   Final Clinical Impressions(s) / UC Diagnoses   Final diagnoses:  Acute non-recurrent pansinusitis    ED Prescriptions    Medication Sig Dispense Auth. Provider   doxycycline (VIBRAMYCIN) 100 MG capsule Take 1 capsule (100 mg total) by mouth 2 (two) times daily. 14 capsule Welden Hausmann V, PA-C   ipratropium (ATROVENT) 0.06 % nasal spray  Place 2 sprays into both nostrils 4 (four) times daily. 15 mL Savian Mazon V, PA-C   benzonatate (TESSALON) 200 MG capsule Take 1 capsule (200 mg total) by mouth every 8 (eight) hours. 21 capsule Ok Edwards, PA-C     PDMP not reviewed this encounter.   Ok Edwards, PA-C 05/14/20 1902

## 2020-05-14 NOTE — ED Triage Notes (Signed)
Pt c/o sinus pressure, nasal congestion, and cough x4wks.

## 2020-05-28 ENCOUNTER — Other Ambulatory Visit: Payer: Self-pay | Admitting: Cardiology

## 2020-05-28 DIAGNOSIS — I25119 Atherosclerotic heart disease of native coronary artery with unspecified angina pectoris: Secondary | ICD-10-CM

## 2020-07-05 ENCOUNTER — Telehealth: Payer: Self-pay

## 2020-07-05 ENCOUNTER — Other Ambulatory Visit: Payer: Self-pay

## 2020-07-05 DIAGNOSIS — E782 Mixed hyperlipidemia: Secondary | ICD-10-CM

## 2020-07-05 DIAGNOSIS — I25119 Atherosclerotic heart disease of native coronary artery with unspecified angina pectoris: Secondary | ICD-10-CM

## 2020-07-05 MED ORDER — LIVALO 4 MG PO TABS: 1.0000 | ORAL_TABLET | Freq: Every day | ORAL | 3 refills | Status: DC

## 2020-07-05 NOTE — Telephone Encounter (Signed)
Spoke to patient's pharmacy regarding his medication Livalo pharmacy states he doesn't need PA its going through his insurance but he needs to pay $200 something out of pocket I spoke to patient as well Baxter Flattery spoke to him we have samples and advice him to get in contact with his insurance

## 2020-08-13 ENCOUNTER — Other Ambulatory Visit: Payer: Self-pay | Admitting: Cardiology

## 2020-08-13 DIAGNOSIS — E782 Mixed hyperlipidemia: Secondary | ICD-10-CM

## 2020-08-13 DIAGNOSIS — I25119 Atherosclerotic heart disease of native coronary artery with unspecified angina pectoris: Secondary | ICD-10-CM

## 2020-09-11 ENCOUNTER — Other Ambulatory Visit: Payer: Self-pay

## 2020-09-11 DIAGNOSIS — I25119 Atherosclerotic heart disease of native coronary artery with unspecified angina pectoris: Secondary | ICD-10-CM

## 2020-09-11 DIAGNOSIS — E782 Mixed hyperlipidemia: Secondary | ICD-10-CM

## 2020-09-11 MED ORDER — LIVALO 4 MG PO TABS: 1.0000 | ORAL_TABLET | Freq: Every day | ORAL | 3 refills | Status: DC

## 2020-09-21 ENCOUNTER — Other Ambulatory Visit: Payer: Self-pay

## 2020-09-21 MED ORDER — NITROGLYCERIN 0.4 MG SL SUBL: 0.4000 mg | SUBLINGUAL_TABLET | SUBLINGUAL | 3 refills | Status: DC | PRN

## 2020-11-29 ENCOUNTER — Other Ambulatory Visit: Payer: Self-pay

## 2020-11-29 MED ORDER — HYDROCHLOROTHIAZIDE 12.5 MG PO CAPS: 12.5000 mg | ORAL_CAPSULE | Freq: Every day | ORAL | 3 refills | Status: DC

## 2020-12-20 ENCOUNTER — Other Ambulatory Visit: Payer: Self-pay

## 2020-12-20 MED ORDER — LOSARTAN POTASSIUM 100 MG PO TABS: 100.0000 mg | ORAL_TABLET | Freq: Every day | ORAL | 3 refills | Status: DC

## 2020-12-25 ENCOUNTER — Other Ambulatory Visit: Payer: Self-pay

## 2020-12-25 MED ORDER — AMLODIPINE BESYLATE 10 MG PO TABS: 10.0000 mg | ORAL_TABLET | Freq: Every day | ORAL | 1 refills | Status: DC

## 2021-01-30 ENCOUNTER — Ambulatory Visit: Payer: 59 | Admitting: Cardiology

## 2021-01-31 ENCOUNTER — Telehealth: Payer: Self-pay

## 2021-01-31 NOTE — Telephone Encounter (Signed)
Patient has an appointment in august. Pt will be coming in to pick up samples for livalo 01/31/2021.

## 2021-02-25 ENCOUNTER — Ambulatory Visit: Payer: 59 | Admitting: Cardiology

## 2021-02-27 ENCOUNTER — Ambulatory Visit: Payer: 59 | Admitting: Cardiology

## 2021-03-01 ENCOUNTER — Ambulatory Visit
Admission: EM | Admit: 2021-03-01 | Discharge: 2021-03-01 | Disposition: A | Discharge status: Home / Self Care | Payer: 59 | Attending: Internal Medicine | Admitting: Internal Medicine

## 2021-03-01 ENCOUNTER — Other Ambulatory Visit: Payer: Self-pay

## 2021-03-01 ENCOUNTER — Encounter: Payer: Self-pay | Admitting: Emergency Medicine

## 2021-03-01 DIAGNOSIS — H6503 Acute serous otitis media, bilateral: Secondary | ICD-10-CM | POA: Insufficient documentation

## 2021-03-01 DIAGNOSIS — R062 Wheezing: Secondary | ICD-10-CM | POA: Insufficient documentation

## 2021-03-01 DIAGNOSIS — R059 Cough, unspecified: Secondary | ICD-10-CM | POA: Insufficient documentation

## 2021-03-01 DIAGNOSIS — J069 Acute upper respiratory infection, unspecified: Secondary | ICD-10-CM | POA: Diagnosis not present

## 2021-03-01 LAB — POCT RAPID STREP A (OFFICE): Rapid Strep A Screen: NEGATIVE

## 2021-03-01 MED ORDER — AZITHROMYCIN 500 MG PO TABS: 500.0000 mg | ORAL_TABLET | Freq: Every day | ORAL | 0 refills | Status: AC

## 2021-03-01 MED ORDER — BENZONATATE 100 MG PO CAPS: 100.0000 mg | ORAL_CAPSULE | Freq: Three times a day (TID) | ORAL | 0 refills | Status: DC | PRN

## 2021-03-01 MED ORDER — ALBUTEROL SULFATE HFA 108 (90 BASE) MCG/ACT IN AERS: 1.0000 | INHALATION_SPRAY | Freq: Four times a day (QID) | RESPIRATORY_TRACT | 0 refills | Status: DC | PRN

## 2021-03-01 NOTE — ED Provider Notes (Signed)
[facility]    CSN: [id number] Arrival date & time: 03/01/21  1014      History   Chief Complaint Chief Complaint  Patient presents with   URI    HPI Peter Blake is a 55 y.o. male.   Patient presents with 4-day history of sneezing, nasal congestion, cough, fever.  Patient states his symptoms started with sneezing for 2 days and then progressed to nasal congestion and cough.  Denies any shortness of breath or chest pain.  Patient not sure of T-max at home.  Has taken over-the-counter Sudafed, Coricidin HBP, Tylenol, saline spray with some improvement in symptoms.  Patient took 2 at home COVID-19 tests that were both negative.  Denies any known sick contacts.   URI  Past Medical History:  Diagnosis Date   Coronary artery disease    Diabetes mellitus without complication ([place]) [id number]   Type I    GERD (gastroesophageal reflux disease)    Hyperlipidemia    Hypertension    Peripheral vascular disease ([place])     Patient Active Problem List   Diagnosis Date Noted   Post PTCA 09/29/2017   Coronary artery disease with angina pectoris ([place]) 09/27/2017   Claudication in peripheral vascular disease ([place]) 09/20/2017   Cholelithiasis 03/16/2014   Cholecystitis, acute with cholelithiasis 03/16/2014   COUGH DUE TO ACE INHIBITORS 08/13/2009   DIABETES MELLITUS, TYPE I 11/12/2007   HYPERCHOLESTEROLEMIA 10/11/2007   HYPERTENSION 10/11/2007   NUMBNESS 10/11/2007    Past Surgical History:  Procedure Laterality Date   CHOLECYSTECTOMY N/A 03/16/2014   Procedure: LAPAROSCOPIC CHOLECYSTECTOMY WITH INTRAOPERATIVE CHOLANGIOGRAM;  Surgeon: [name], MD;  [facility];  Service: General;  Laterality: N/A;   CORONARY STENT INTERVENTION N/A 09/29/2017   Procedure: CORONARY STENT INTERVENTION;  Surgeon: Nigel Mormon, MD;  Location: [place] [facility] LAB;  Service: Cardiovascular;  Laterality: N/A;   CORONARY STENT PLACEMENT  2019   2 stents   LEFT HEART CATH AND  CORONARY ANGIOGRAPHY N/A 09/22/2017   Procedure: LEFT HEART CATH AND CORONARY ANGIOGRAPHY;  Surgeon: Nigel Mormon, MD;  Location: [place] CV LAB;  Service: Cardiovascular;  Laterality: N/A;   LOWER EXTREMITY ANGIOGRAPHY N/A 09/22/2017   Procedure: LOWER EXTREMITY ANGIOGRAPHY;  Surgeon: Nigel Mormon, MD;  Location: [place] [facility] LAB;  Service: Cardiovascular;  Laterality: N/A;       Home Medications    Prior to Admission medications   Medication Sig Start Date End Date Taking? Authorizing Provider  albuterol (VENTOLIN HFA) 108 (90 Base) MCG/ACT inhaler Inhale 1-2 puffs into the lungs every 6 (six) hours as needed for wheezing or shortness of breath. 03/01/21  Yes Odis Luster, FNP  amLODipine (NORVASC) 10 MG tablet Take 1 tablet (10 mg total) by mouth daily. 12/25/20  Yes Adrian Prows, MD  aspirin EC 81 MG tablet Take 81 mg by mouth daily.   Yes [provider]  azithromycin (ZITHROMAX) 500 MG tablet Take 1 tablet (500 mg total) by mouth daily for 5 days. 03/01/21 03/06/21 Yes Odis Luster, FNP  benzonatate (TESSALON) 100 MG capsule Take 1 capsule (100 mg total) by mouth every 8 (eight) hours as needed for cough. 03/01/21  Yes Odis Luster, FNP  Continuous Blood Gluc Sensor (FREESTYLE LIBRE 2 SENSOR SYSTM) MISC USE AS DIRECTED CHANGING EVERY 14 DAY 08/02/19  Yes [provider]  ezetimibe (ZETIA) 10 MG tablet TAKE 1 TABLET BY MOUTH EVERY DAY 05/28/20  Yes Tolia, Sunit, DO  hydrochlorothiazide (MICROZIDE) 12.5 MG capsule Take 1 capsule (12.5 mg total) by mouth daily. 11/29/20  Yes Adrian Prows, MD  Insulin Glargine (TOUJEO SOLOSTAR [place]) Inject into the skin. 15 units at bedtime   Yes [provider]  insulin lispro (HUMALOG) 100 UNIT/ML injection Inject 20-30 Units into the skin 3 (three) times daily before meals. Per sliding scale   Yes [provider]  losartan (COZAAR) 100 MG tablet Take 1 tablet (100 mg total) by mouth daily. 12/20/20  Yes Adrian Prows, MD  nitroGLYCERIN (NITROSTAT) 0.4 MG SL tablet Place 1 tablet (0.4 mg total) under the tongue every 5 (five) minutes as needed for chest pain. 09/21/20 09/21/21 Yes Adrian Prows, MD  Pitavastatin Calcium (LIVALO) 4 MG TABS Take 1 tablet (4 mg total) by mouth daily. 09/11/20  Yes Adrian Prows, MD  polyvinyl alcohol (LIQUIFILM TEARS) 1.4 % ophthalmic solution Place 1 drop into both eyes as needed for dry eyes.   Yes [provider]  tobramycin (TOBREX) 0.3 % ophthalmic solution Place 1 drop into both eyes as directed. Prior to eye injections 08/03/18  Yes [provider]  doxycycline (VIBRAMYCIN) 100 MG capsule Take 1 capsule (100 mg total) by mouth 2 (two) times daily. 05/14/20   Tasia Catchings, Amy V, PA-C  ipratropium (ATROVENT) 0.06 % nasal spray Place 2 sprays into both nostrils 4 (four) times daily. 05/14/20   Ok Edwards, PA-C    Family History Family History  Adopted: Yes  Problem Relation Age of Onset   Cancer Mother    Heart attack Mother    Lung cancer Father    Colon cancer Neg Hx     Social History Social History   Tobacco Use   Smoking status: Never   Smokeless tobacco: Never  Vaping Use   Vaping Use: Never used  Substance Use Topics   Alcohol use: No   Drug use: Not Currently     Allergies   Coreg [carvedilol], Augmentin [amoxicillin-pot clavulanate], Metformin and related, Metoprolol, and Invokana [canagliflozin]   Review of Systems Review of Systems Per HPI  Physical Exam Triage Vital Signs ED Triage Vitals  Enc Vitals Group     BP 03/01/21 1026 (!) 161/91     Pulse Rate 03/01/21 1026 95     Resp 03/01/21 1026 18     Temp 03/01/21 1026 98.3 F (36.8 C)     Temp Source 03/01/21 1026 Oral     SpO2 03/01/21 1026 95 %     Weight 03/01/21 1028 215 lb (97.5 kg)     Height 03/01/21 1028 '5\' 8"'$  (1.727 m)     Head Circumference --      Peak Flow --      Pain Score 03/01/21 1028 0     Pain Loc --      Pain Edu? --      Excl. in [place]? --    No data  found.  Updated Vital Signs BP (!) 161/91 (BP Location: Left Arm)   Pulse 95   Temp 98.3 F (36.8 C) (Oral)   Resp 18   Ht '5\' 8"'$  (1.727 m)   Wt 215 lb (97.5 kg)   SpO2 95%   BMI 32.69 kg/m   Visual Acuity Right Eye Distance:   Left Eye Distance:   Bilateral Distance:    Right Eye Near:   Left Eye Near:    Bilateral Near:     Physical Exam Constitutional:      General: He is  not in acute distress.    Appearance: Normal appearance.  HENT:     Head: Normocephalic and atraumatic.     Right Ear: Ear canal normal. A middle ear effusion is present. Tympanic membrane is not bulging.     Left Ear: Ear canal normal. A middle ear effusion is present. Tympanic membrane is not bulging.     Ears:     Comments: Cloudy fluid to left tympanic membrane.    Nose: Congestion present.     Mouth/Throat:     Mouth: Mucous membranes are moist.     Pharynx: Posterior oropharyngeal erythema present.  Eyes:     Extraocular Movements: Extraocular movements intact.     Conjunctiva/sclera: Conjunctivae normal.     Pupils: Pupils are equal, round, and reactive to light.  Cardiovascular:     Rate and Rhythm: Normal rate and regular rhythm.     Pulses: Normal pulses.     Heart sounds: Normal heart sounds.  Pulmonary:     Effort: Pulmonary effort is normal. No respiratory distress.     Breath sounds: Wheezing present. No rhonchi.  Abdominal:     General: Abdomen is flat. Bowel sounds are normal.     Palpations: Abdomen is soft.  Musculoskeletal:        General: Normal range of motion.     Cervical back: Normal range of motion.  Skin:    General: Skin is warm and dry.  Neurological:     General: No focal deficit present.     Mental Status: He is alert and oriented to person, place, and time. Mental status is at baseline.  Psychiatric:        Mood and Affect: Mood normal.        Behavior: Behavior normal.     UC Treatments / Results  Labs (all labs ordered are listed, but only abnormal  results are displayed) Labs Reviewed  CULTURE, GROUP A STREP ([place])  NOVEL CORONAVIRUS, NAA  POCT RAPID STREP A (OFFICE)    EKG   Radiology No results found.  Procedures Procedures (including critical care time)  Medications Ordered in UC Medications - No data to display  Initial Impression / Assessment and Plan / UC Course  I have reviewed the triage vital signs and the nursing notes.  Pertinent labs & imaging results that were available during my care of the patient were reviewed by me and considered in my medical decision making (see chart for details).     Patient does have some cloudy fluid to left tympanic membrane. Will treat with azithromycin x5 days that we will treat this start of ear infection as well as upper respiratory infection.  Also prescribed albuterol inhaler to take as needed for wheezing.  Unable to prescribe steroid to decrease inflammation in chest and help with wheezing at this time due to patient having type 1 diabetes.  Patient may continue over-the-counter medications to alleviate symptoms.  Discussed these medications with patient.  Also prescribed benzonatate to take as needed for cough.  Patient advised to go to the hospital if shortness of breath or chest pain develop.  Rapid strep test was negative in urgent care today.  Throat culture and COVID-19 viral swab are pending.  Discussed strict return precautions. Patient verbalized understanding and is agreeable with plan.  Final Clinical Impressions(s) / UC Diagnoses   Final diagnoses:  Acute upper respiratory infection  Non-recurrent acute serous otitis media of both ears  Cough  Wheezing     Discharge  Instructions      You have been prescribed azithromycin antibiotic, benzonatate to take as needed for cough, and albuterol inhaler to take as needed for wheezing, shortness of breath, cough.  Please go to the hospital if symptoms worsen.  You may continue over-the-counter medications that you have  been taking in addition to these medications.     ED Prescriptions     Medication Sig Dispense Auth. Provider   azithromycin (ZITHROMAX) 500 MG tablet Take 1 tablet (500 mg total) by mouth daily for 5 days. 5 tablet Odis Luster, FNP   benzonatate (TESSALON) 100 MG capsule Take 1 capsule (100 mg total) by mouth every 8 (eight) hours as needed for cough. 21 capsule Odis Luster, FNP   albuterol (VENTOLIN HFA) 108 (90 Base) MCG/ACT inhaler Inhale 1-2 puffs into the lungs every 6 (six) hours as needed for wheezing or shortness of breath. 1 each Odis Luster, FNP      PDMP not reviewed this encounter.   Odis Luster, FNP 03/01/21 1130

## 2021-03-01 NOTE — Discharge Instructions (Addendum)
You have been prescribed azithromycin antibiotic, benzonatate to take as needed for cough, and albuterol inhaler to take as needed for wheezing, shortness of breath, cough.  Please go to the hospital if symptoms worsen.  You may continue over-the-counter medications that you have been taking in addition to these medications.

## 2021-03-01 NOTE — ED Triage Notes (Signed)
Patient states on Tuesday started sneezing, nasal congestion on Wednesday, low grade fever Wednesday night, negative COVID test x 2.  Non-productive cough, took Sudafed, Chlorcedin, Tylenol, saline spray.  Patient is vaccinated for COVID.

## 2021-03-02 LAB — NOVEL CORONAVIRUS, NAA: SARS-CoV-2, NAA: NOT DETECTED

## 2021-03-02 LAB — SARS-COV-2, NAA 2 DAY TAT

## 2021-03-04 LAB — CULTURE, GROUP A STREP (THRC)

## 2021-04-01 ENCOUNTER — Telehealth: Payer: Self-pay | Admitting: Cardiology

## 2021-04-01 NOTE — Telephone Encounter (Signed)
Patient requesting samples for Livalo '4mg'$ 

## 2021-04-02 NOTE — Telephone Encounter (Signed)
Samples left upfront left vm for patient stating same

## 2021-04-18 ENCOUNTER — Other Ambulatory Visit: Payer: Self-pay | Admitting: Cardiology

## 2021-04-23 ENCOUNTER — Other Ambulatory Visit: Payer: Self-pay

## 2021-04-23 ENCOUNTER — Ambulatory Visit: Payer: 59 | Admitting: Cardiology

## 2021-04-23 ENCOUNTER — Encounter: Payer: Self-pay | Admitting: Cardiology

## 2021-04-23 VITALS — BP 136/84 | HR 88 | Temp 98.5°F | Resp 16 | Ht 68.0 in | Wt 214.8 lb

## 2021-04-23 DIAGNOSIS — I25119 Atherosclerotic heart disease of native coronary artery with unspecified angina pectoris: Secondary | ICD-10-CM

## 2021-04-23 DIAGNOSIS — E782 Mixed hyperlipidemia: Secondary | ICD-10-CM

## 2021-04-23 DIAGNOSIS — I10 Essential (primary) hypertension: Secondary | ICD-10-CM

## 2021-04-23 NOTE — Progress Notes (Signed)
Primary Physician/Referring:  [name], MD  Patient ID: Peter Blake, male    DOB: [birth year], 55 y.o.   MRN: [id number]  Chief Complaint  Patient presents with   Coronary Artery Disease   Follow-up    1 year   Hyperlipidemia   HPI:    Peter Blake  is a 55 y.o. 55 y/o Caucasian male with CAD s/p PCI to anomaloous LCx and mid LAD (09/2017), hypertension, hyperlipidemia, statin intolerance, uncontrolled type 1 DM, erectile dysfunction.  He is presently tolerating Livalo and Zetia. He developed diabetic retinopathy and also has peripheral neuropathy.  Patient presents for annual follow-up.  Last office visit he was stable from a cardiovascular standpoint and no changes were made. Patient's primary concern today is peripheral neuropathy which he states is bothersome but has been relatively stable since last office visit.    Notably patient has been unable to tolerate eta blocker therapy in the past.  He has tried Coreg and metoprolol, but developed fatigue and diarrhea respectively.  Pain, palpitations, syncope, near syncope.  Denies dyspnea, dizziness, orthopnea, PND, leg swelling.  Past Medical History:  Diagnosis Date   Coronary artery disease    Diabetes mellitus without complication ([place]) 6294   Type I    GERD (gastroesophageal reflux disease)    Hyperlipidemia    Hypertension    Peripheral vascular disease ([place])    Past Surgical History:  Procedure Laterality Date   CHOLECYSTECTOMY N/A 03/16/2014   Procedure: LAPAROSCOPIC CHOLECYSTECTOMY WITH INTRAOPERATIVE CHOLANGIOGRAM;  Surgeon: [name], MD;  Location: [facility];  Service: General;  Laterality: N/A;   CORONARY STENT INTERVENTION N/A 09/29/2017   Procedure: CORONARY STENT INTERVENTION;  Surgeon: Nigel Mormon, MD;  Location: [facility];  Service: Cardiovascular;  Laterality: N/A;   CORONARY STENT PLACEMENT  2019   2 stents   LEFT HEART CATH AND CORONARY ANGIOGRAPHY N/A 09/22/2017    Procedure: LEFT HEART CATH AND CORONARY ANGIOGRAPHY;  Surgeon: Nigel Mormon, MD;  Location: [facility];  Service: Cardiovascular;  Laterality: N/A;   LOWER EXTREMITY ANGIOGRAPHY N/A 09/22/2017   Procedure: LOWER EXTREMITY ANGIOGRAPHY;  Surgeon: Nigel Mormon, MD;  Location: [facility];  Service: Cardiovascular;  Laterality: N/A;   Family History  Adopted: Yes  Problem Relation Age of Onset   Cancer Mother    Heart attack Mother    Lung cancer Father    Colon cancer Neg Hx    Social History   Tobacco Use   Smoking status: Never   Smokeless tobacco: Never  Substance Use Topics   Alcohol use: No   Marital Status: Married   ROS  Review of Systems  Constitutional: Negative for malaise/fatigue and weight gain.  Cardiovascular:  Positive for leg swelling (minimal). Negative for chest pain, claudication, dyspnea on exertion, near-syncope, orthopnea, palpitations, paroxysmal nocturnal dyspnea and syncope.  Respiratory:  Negative for shortness of breath.   Gastrointestinal:  Negative for melena.  Neurological:  Negative for dizziness.  Objective   Vitals with BMI 04/23/2021 03/01/2021 05/14/2020  Height 5' 8"  5' 8"  -  Weight 214 lbs 13 oz 215 lbs -  BMI 76.54 65.0 -  Systolic 354 656 812  Diastolic 84 91 80  Pulse 88 95 98    Blood pressure 136/84, pulse 88, temperature 98.5 F (36.9 C), temperature source Temporal, resp. rate 16, height 5' 8"  (1.727 m), weight 214 lb 12.8 oz (97.4 kg), SpO2 96 %. Body mass index is  32.66 kg/m.   Physical Exam Vitals reviewed.  HENT:     Head: Normocephalic and atraumatic.  Neck:     Thyroid: No thyromegaly.     Vascular: No JVD.  Cardiovascular:     Rate and Rhythm: Normal rate and regular rhythm.     Pulses:          Carotid pulses are 2+ on the right side and 2+ on the left side.      Radial pulses are 2+ on the right side and 2+ on the left side.       Femoral pulses are 2+ on the right side and 2+ on the  left side.      Popliteal pulses are 2+ on the right side and 2+ on the left side.       Dorsalis pedis pulses are 0 on the right side and 0 on the left side.       Posterior tibial pulses are 2+ on the right side and 2+ on the left side.     Heart sounds: Normal heart sounds, S1 normal and S2 normal. No murmur heard.   No gallop.     Comments: No JVD.  Pulmonary:     Effort: Pulmonary effort is normal. No respiratory distress.     Breath sounds: Normal breath sounds. No wheezing, rhonchi or rales.  Musculoskeletal:     Right lower leg: Edema (trace) present.     Left lower leg: Edema (trace) present.  Skin:    Capillary Refill: Capillary refill takes less than 2 seconds.  Neurological:     Mental Status: He is alert.   Laboratory examination:   No results for input(s): NA, K, CL, CO2, GLUCOSE, BUN, CREATININE, CALCIUM, GFRNONAA, GFRAA in the last 8760 hours. CMP Latest Ref Rng & Units 08/23/2018 09/30/2017 09/30/2017  Glucose 70 - 99 mg/dL 122(H) 208(H) 131(H)  BUN 6 - 20 mg/dL 20 17 19   Creatinine 0.61 - 1.24 mg/dL 1.28(H) 1.08 1.11  Sodium 135 - 145 mmol/L 138 136 137  Potassium 3.5 - 5.1 mmol/L 4.1 3.9 4.0  Chloride 98 - 111 mmol/L 104 104 104  CO2 22 - 32 mmol/L 27 25 24   Calcium 8.9 - 10.3 mg/dL 9.8 8.7(L) 8.8(L)  Total Protein 6.0 - 8.3 g/dL - - -  Total Bilirubin 0.3 - 1.2 mg/dL - - -  Alkaline Phos 39 - 117 U/L - - -  AST 0 - 37 U/L - - -  ALT 0 - 53 U/L - - -   CBC Latest Ref Rng & Units 08/23/2018 09/30/2017 09/29/2017  WBC 4.0 - 10.5 K/uL 9.0 9.2 8.6  Hemoglobin 13.0 - 17.0 g/dL 14.9 12.9(L) 13.6  Hematocrit 39.0 - 52.0 % 44.7 37.8(L) 39.1  Platelets 150 - 400 K/uL 286 289 288   Lipid Panel  No results found for: CHOL, TRIG, HDL, CHOLHDL, VLDL, LDLCALC, LDLDIRECT HEMOGLOBIN A1C Lab Results  Component Value Date   HGBA1C 9.3 (H) 03/17/2014   MPG 220 (H) 03/17/2014   External Labs: 11/02/2019: Total cholesterol 128, HDL 28, LDL 79, triglycerides 145 Creatinine  1.26  Labs 04/26/2019: Serum glucose 176 mg, potassium 4.9, BUN 19, creatinine 1.19, eGFR 69/80 ML.   Total cholesterol 139, triglycerides 137, HDL 28, LDL 86.  Allergies   Allergies  Allergen Reactions   Coreg [Carvedilol] Other (See Comments)    Fatigue   Augmentin [Amoxicillin-Pot Clavulanate] Nausea And Vomiting    Did it involve swelling of the face/tongue/throat, SOB, or  low BP? no Did it involve sudden or severe rash/hives, skin peeling, or any reaction on the inside of your mouth or nose? no Did you need to seek medical attention at a hospital or doctor's office? no When did it last happen?      2015 If all above answers are "NO", may proceed with cephalosporin use.    Metformin And Related Diarrhea   Metoprolol Other (See Comments)    Diarrhoea   Invokana [Canagliflozin] Rash    Medications Prior to Visit:   Outpatient Medications Prior to Visit  Medication Sig Dispense Refill   amLODipine (NORVASC) 10 MG tablet Take 1 tablet (10 mg total) by mouth daily. 90 tablet 1   aspirin EC 81 MG tablet Take 81 mg by mouth daily.     Continuous Blood Gluc Sensor (FREESTYLE LIBRE 2 SENSOR SYSTM) MISC USE AS DIRECTED CHANGING EVERY 14 DAY     ezetimibe (ZETIA) 10 MG tablet TAKE 1 TABLET BY MOUTH EVERY DAY 90 tablet 3   hydrochlorothiazide (MICROZIDE) 12.5 MG capsule TAKE 1 CAPSULE BY MOUTH EVERY DAY 30 capsule 3   Insulin Glargine (TOUJEO SOLOSTAR [place]) Inject into the skin. 15 units at bedtime     insulin lispro (HUMALOG) 100 UNIT/ML injection Inject 20-30 Units into the skin 3 (three) times daily before meals. Per sliding scale     losartan (COZAAR) 100 MG tablet Take 1 tablet (100 mg total) by mouth daily. 90 tablet 3   nitroGLYCERIN (NITROSTAT) 0.4 MG SL tablet Place 1 tablet (0.4 mg total) under the tongue every 5 (five) minutes as needed for chest pain. 25 tablet 3   Pitavastatin Calcium (LIVALO) 4 MG TABS Take 1 tablet (4 mg total) by mouth daily. 90 tablet 3   polyvinyl alcohol  (LIQUIFILM TEARS) 1.4 % ophthalmic solution Place 1 drop into both eyes as needed for dry eyes.     tobramycin (TOBREX) 0.3 % ophthalmic solution Place 1 drop into both eyes as directed. Prior to eye injections     albuterol (VENTOLIN HFA) 108 (90 Base) MCG/ACT inhaler Inhale 1-2 puffs into the lungs every 6 (six) hours as needed for wheezing or shortness of breath. 1 each 0   benzonatate (TESSALON) 100 MG capsule Take 1 capsule (100 mg total) by mouth every 8 (eight) hours as needed for cough. 21 capsule 0   doxycycline (VIBRAMYCIN) 100 MG capsule Take 1 capsule (100 mg total) by mouth 2 (two) times daily. 14 capsule 0   ipratropium (ATROVENT) 0.06 % nasal spray Place 2 sprays into both nostrils 4 (four) times daily. 15 mL 0   No facility-administered medications prior to visit.   Final Medications at End of Visit    Current Meds  Medication Sig   amLODipine (NORVASC) 10 MG tablet Take 1 tablet (10 mg total) by mouth daily.   aspirin EC 81 MG tablet Take 81 mg by mouth daily.   Continuous Blood Gluc Sensor (FREESTYLE LIBRE 2 SENSOR SYSTM) MISC USE AS DIRECTED CHANGING EVERY 14 DAY   ezetimibe (ZETIA) 10 MG tablet TAKE 1 TABLET BY MOUTH EVERY DAY   hydrochlorothiazide (MICROZIDE) 12.5 MG capsule TAKE 1 CAPSULE BY MOUTH EVERY DAY   Insulin Glargine (TOUJEO SOLOSTAR [place]) Inject into the skin. 15 units at bedtime   insulin lispro (HUMALOG) 100 UNIT/ML injection Inject 20-30 Units into the skin 3 (three) times daily before meals. Per sliding scale   losartan (COZAAR) 100 MG tablet Take 1 tablet (100 mg total) by mouth daily.  nitroGLYCERIN (NITROSTAT) 0.4 MG SL tablet Place 1 tablet (0.4 mg total) under the tongue every 5 (five) minutes as needed for chest pain.   Pitavastatin Calcium (LIVALO) 4 MG TABS Take 1 tablet (4 mg total) by mouth daily.   polyvinyl alcohol (LIQUIFILM TEARS) 1.4 % ophthalmic solution Place 1 drop into both eyes as needed for dry eyes.   tobramycin (TOBREX) 0.3 %  ophthalmic solution Place 1 drop into both eyes as directed. Prior to eye injections   Radiology:  No results found.  Cardiac Studies:   Echocardiogram 09/02/2017: Left ventricle cavity is normal in size. Moderate concentric hypertrophy of the left ventricle. Normal global wall motion. Doppler evidence of grade I (impaired) diastolic dysfunction, normal LAP. Calculated EF 55%. Mild to moderate mitral regurgitation. Inadequate tricuspid regurgitation jet to estimate pulmonary artery pressure. Normal right atrial pressure.   Abdominal angiogram 09/22/2017:  Abdominal aorta distal 30%. No significant PAD. Slow flow. No Iliac artery disease.   Coronary angiogram 09/29/2017:  Severe multivessel CAD: LAD: Mid LAD 80% stenosis, Anomalous LCx: Prox 80% stenosis RCA on angiogram 09/22/16, PL branch 100% occlusion, diffuse disease with grade 1 collaterals from LAD septal perforators. S/P  2.5 X 23 mm Xience DES to anamolous LCx  and  3.5 X 18 mm Xience DES to mid LAD w 0% residual stenosis.   EKG   04/23/2021: Sinus rhythm at a rate of 84 bpm.  Normal axis.  Right bundle branch block.  Nonspecific T wave abnormality, cannot exclude inferior and lateral ischemia.  Compared to EKG 01/30/2020, no significant change.  Assessment     ICD-10-CM   1. Coronary artery disease involving native coronary artery of native heart with angina pectoris ([place])  I25.119 Lipid Panel With LDL/HDL Ratio    Hgb A1c w/o eAG    2. Mixed hyperlipidemia  E78.2     3. Essential hypertension  I10 EKG 12-Lead    Lipid Panel With LDL/HDL Ratio     No orders of the defined types were placed in this encounter. Medications Discontinued During This Encounter  Medication Reason   albuterol (VENTOLIN HFA) 108 (90 Base) MCG/ACT inhaler Error   benzonatate (TESSALON) 100 MG capsule Error   doxycycline (VIBRAMYCIN) 100 MG capsule Error   ipratropium (ATROVENT) 0.06 % nasal spray Error    Recommendations:   Peter Blake   is a 55 y.o. Caucasian male with CAD s/p PCI to anomaloous LCx and mid LAD (09/2017), hypertension, hyperlipidemia, statin intolerance, uncontrolled type 1 DM, erectile dysfunction.  He is presently tolerating Livalo and Zetia. He now has developed worsening diabetic retinopathy and also has peripheral neuropathy.  Patient presents for annual follow-up.  Last office visit he was stable from a cardiovascular standpoint and no changes were made.  Patient's physical exam and EKG are unchanged compared to previous.  Patient's blood pressure is minimally elevated in the office today, however it is well controlled upon home monitoring.  Will not make changes to antihypertensive medications at this time.  I personally reviewed external records, there is no recent lipid profile testing for review.  We will obtain lipid profile testing as well as A1c at this time.  Patient is otherwise stable from a cardiovascular standpoint.  Encouraged him to make diet and lifestyle modifications, particularly with the goal of weight loss, he verbalized understanding agreement.  Follow-up in 1 year, sooner if needed, for CAD, hypertension, hyperlipidemia.   Alethia Berthold, PA-C 04/23/2021, 2:44 PM Office: [phone]

## 2021-05-30 ENCOUNTER — Other Ambulatory Visit: Payer: Self-pay | Admitting: Cardiology

## 2021-05-30 DIAGNOSIS — I25119 Atherosclerotic heart disease of native coronary artery with unspecified angina pectoris: Secondary | ICD-10-CM

## 2021-06-07 ENCOUNTER — Other Ambulatory Visit: Payer: Self-pay | Admitting: Cardiology

## 2021-07-08 ENCOUNTER — Ambulatory Visit
Admission: EM | Admit: 2021-07-08 | Discharge: 2021-07-08 | Disposition: A | Discharge status: Home / Self Care | Payer: 59 | Attending: Physician Assistant | Admitting: Physician Assistant

## 2021-07-08 ENCOUNTER — Other Ambulatory Visit: Payer: Self-pay

## 2021-07-08 DIAGNOSIS — J069 Acute upper respiratory infection, unspecified: Secondary | ICD-10-CM | POA: Diagnosis not present

## 2021-07-08 MED ORDER — OSELTAMIVIR PHOSPHATE 75 MG PO CAPS: 75.0000 mg | ORAL_CAPSULE | Freq: Two times a day (BID) | ORAL | 0 refills | Status: DC

## 2021-07-08 NOTE — ED Triage Notes (Signed)
Pt c/o cough w/ phlegm, fever at home (101.70f), headache, earaches (bilat), body aches,    Onset x2 days

## 2021-07-08 NOTE — ED Provider Notes (Signed)
[facility]    CSN: [id number] Arrival date & time: 07/08/21  0802      History   Chief Complaint Chief Complaint  Patient presents with   Cough    HPI Peter Blake is a 55 y.o. male.   Patient here today for evaluation of headache, body aches, productive cough, and congestion that started 2 days ago.  He reports she has also had sore throat and ear pain.  He has been taking over-the-counter treatment including Tylenol and Mucinex without significant relief.  He has had fever with T-max of 101.8 Fahrenheit.  He reports that his boss's family has been sick for the last several weeks.  The history is provided by the patient.  Cough Associated symptoms: chills, ear pain, fever, headaches, myalgias and sore throat   Associated symptoms: no eye discharge and no shortness of breath    Past Medical History:  Diagnosis Date   Coronary artery disease    Diabetes mellitus without complication ([place]) 3419   Type I    GERD (gastroesophageal reflux disease)    Hyperlipidemia    Hypertension    Peripheral vascular disease ([place])     Patient Active Problem List   Diagnosis Date Noted   Post PTCA 09/29/2017   Coronary artery disease with angina pectoris ([place]) 09/27/2017   Claudication in peripheral vascular disease ([place]) 09/20/2017   Cholelithiasis 03/16/2014   Cholecystitis, acute with cholelithiasis 03/16/2014   COUGH DUE TO ACE INHIBITORS 08/13/2009   DIABETES MELLITUS, TYPE I 11/12/2007   HYPERCHOLESTEROLEMIA 10/11/2007   HYPERTENSION 10/11/2007   NUMBNESS 10/11/2007    Past Surgical History:  Procedure Laterality Date   CHOLECYSTECTOMY N/A 03/16/2014   Procedure: LAPAROSCOPIC CHOLECYSTECTOMY WITH INTRAOPERATIVE CHOLANGIOGRAM;  Surgeon: [name], MD;  Location: [facility];  Service: General;  Laterality: N/A;   CORONARY STENT INTERVENTION N/A 09/29/2017   Procedure: CORONARY STENT INTERVENTION;  Surgeon: Nigel Mormon, MD;  Location: [place]  [facility];  Service: Cardiovascular;  Laterality: N/A;   CORONARY STENT PLACEMENT  2019   2 stents   LEFT HEART CATH AND CORONARY ANGIOGRAPHY N/A 09/22/2017   Procedure: LEFT HEART CATH AND CORONARY ANGIOGRAPHY;  Surgeon: Nigel Mormon, MD;  Location: [facility];  Service: Cardiovascular;  Laterality: N/A;   LOWER EXTREMITY ANGIOGRAPHY N/A 09/22/2017   Procedure: LOWER EXTREMITY ANGIOGRAPHY;  Surgeon: Nigel Mormon, MD;  Location: [place] [facility];  Service: Cardiovascular;  Laterality: N/A;       Home Medications    Prior to Admission medications   Medication Sig Start Date End Date Taking? Authorizing Provider  oseltamivir (TAMIFLU) 75 MG capsule Take 1 capsule (75 mg total) by mouth every 12 (twelve) hours. 07/08/21  Yes Francene Finders, PA-C  amLODipine (NORVASC) 10 MG tablet TAKE 1 TABLET BY MOUTH EVERY DAY 06/07/21   Adrian Prows, MD  aspirin EC 81 MG tablet Take 81 mg by mouth daily.    [provider]  Continuous Blood Gluc Sensor (FREESTYLE LIBRE 2 SENSOR SYSTM) MISC USE AS DIRECTED CHANGING EVERY 14 DAY 08/02/19   [provider]  ezetimibe (ZETIA) 10 MG tablet TAKE 1 TABLET BY MOUTH EVERY DAY 05/31/21   Tolia, Sunit, DO  hydrochlorothiazide (MICROZIDE) 12.5 MG capsule TAKE 1 CAPSULE BY MOUTH EVERY DAY 04/18/21   Adrian Prows, MD  Insulin Glargine (TOUJEO SOLOSTAR [place]) Inject into the skin. 15 units at bedtime    [provider]  insulin lispro (HUMALOG) 100 UNIT/ML  injection Inject 20-30 Units into the skin 3 (three) times daily before meals. Per sliding scale    [provider]  losartan (COZAAR) 100 MG tablet Take 1 tablet (100 mg total) by mouth daily. 12/20/20   Adrian Prows, MD  nitroGLYCERIN (NITROSTAT) 0.4 MG SL tablet Place 1 tablet (0.4 mg total) under the tongue every 5 (five) minutes as needed for chest pain. 09/21/20 09/21/21  Adrian Prows, MD  Pitavastatin Calcium (LIVALO) 4 MG TABS Take 1 tablet (4 mg total) by mouth daily.  09/11/20   Adrian Prows, MD  polyvinyl alcohol (LIQUIFILM TEARS) 1.4 % ophthalmic solution Place 1 drop into both eyes as needed for dry eyes.    [provider]  tobramycin (TOBREX) 0.3 % ophthalmic solution Place 1 drop into both eyes as directed. Prior to eye injections 08/03/18   [provider]    Family History Family History  Adopted: Yes  Problem Relation Age of Onset   Cancer Mother    Heart attack Mother    Lung cancer Father    Colon cancer Neg Hx     Social History Social History   Tobacco Use   Smoking status: Never   Smokeless tobacco: Never  Vaping Use   Vaping Use: Never used  Substance Use Topics   Alcohol use: No   Drug use: Not Currently     Allergies   Coreg [carvedilol], Augmentin [amoxicillin-pot clavulanate], Metformin and related, Metoprolol, and Invokana [canagliflozin]   Review of Systems Review of Systems  Constitutional:  Positive for chills and fever.  HENT:  Positive for congestion, ear pain and sore throat.   Eyes:  Negative for discharge and redness.  Respiratory:  Positive for cough. Negative for shortness of breath.   Gastrointestinal:  Negative for abdominal pain, nausea and vomiting.  Musculoskeletal:  Positive for myalgias.  Neurological:  Positive for headaches.    Physical Exam Triage Vital Signs ED Triage Vitals [07/08/21 0823]  Enc Vitals Group     BP (!) 145/78     Pulse Rate (!) 102     Resp 18     Temp 99.3 F (37.4 C)     Temp Source Oral     SpO2 95 %     Weight      Height      Head Circumference      Peak Flow      Pain Score 0     Pain Loc      Pain Edu?      Excl. in [place]?    No data found.  Updated Vital Signs BP (!) 145/78 (BP Location: Left Arm)    Pulse (!) 102    Temp 99.3 F (37.4 C) (Oral)    Resp 18    SpO2 95%   Physical Exam Vitals and nursing note reviewed.  Constitutional:      General: He is not in acute distress.    Appearance: He is well-developed. He is not  ill-appearing.  HENT:     Head: Normocephalic and atraumatic.     Right Ear: Tympanic membrane normal.     Left Ear: Tympanic membrane normal.     Nose: Congestion present.     Mouth/Throat:     Mouth: Mucous membranes are moist.     Pharynx: No oropharyngeal exudate or posterior oropharyngeal erythema.     Tonsils: 0 on the right. 0 on the left.  Eyes:     Conjunctiva/sclera: Conjunctivae normal.  Cardiovascular:  Rate and Rhythm: Normal rate and regular rhythm.     Heart sounds: Normal heart sounds. No murmur heard. Pulmonary:     Effort: Pulmonary effort is normal. No respiratory distress.     Breath sounds: Normal breath sounds. No wheezing, rhonchi or rales.  Skin:    General: Skin is warm and dry.  Neurological:     Mental Status: He is alert.  Psychiatric:        Mood and Affect: Mood normal.        Behavior: Behavior normal.     UC Treatments / Results  Labs (all labs ordered are listed, but only abnormal results are displayed) Labs Reviewed  COVID-19, FLU A+B NAA    EKG   Radiology No results found.  Procedures Procedures (including critical care time)  Medications Ordered in UC Medications - No data to display  Initial Impression / Assessment and Plan / UC Course  I have reviewed the triage vital signs and the nursing notes.  Pertinent labs & imaging results that were available during my care of the patient were reviewed by me and considered in my medical decision making (see chart for details).    Suspect likely influenza.  Will treat with Tamiflu and will order screening for both influenza and COVID.  Recommend follow-up in ED if symptoms worsen anyway given type I diabetic status.  Encouraged follow-up with any further concerns.  Final Clinical Impressions(s) / UC Diagnoses   Final diagnoses:  Acute upper respiratory infection   Discharge Instructions   None    ED Prescriptions     Medication Sig Dispense Auth. Provider   oseltamivir  (TAMIFLU) 75 MG capsule Take 1 capsule (75 mg total) by mouth every 12 (twelve) hours. 10 capsule Francene Finders, PA-C      PDMP not reviewed this encounter.   Francene Finders, PA-C 07/08/21 [phone]

## 2021-07-09 LAB — COVID-19, FLU A+B NAA
Influenza A, NAA: NOT DETECTED
Influenza B, NAA: NOT DETECTED
SARS-CoV-2, NAA: DETECTED — AB

## 2021-09-20 ENCOUNTER — Telehealth: Payer: Self-pay

## 2021-09-20 NOTE — Telephone Encounter (Signed)
Patient requested alternative for Livalo, and he is willing to try injection ( Repatha or Praluent) ?

## 2021-09-26 ENCOUNTER — Encounter: Payer: Self-pay | Admitting: Cardiology

## 2021-09-26 ENCOUNTER — Other Ambulatory Visit: Payer: Self-pay | Admitting: Cardiology

## 2021-09-26 DIAGNOSIS — I25119 Atherosclerotic heart disease of native coronary artery with unspecified angina pectoris: Secondary | ICD-10-CM

## 2021-09-26 DIAGNOSIS — E782 Mixed hyperlipidemia: Secondary | ICD-10-CM

## 2021-09-26 MED ORDER — ZYPITAMAG 4 MG PO TABS: 1.0000 | ORAL_TABLET | Freq: Every day | ORAL | 3 refills | Status: DC

## 2021-11-29 ENCOUNTER — Other Ambulatory Visit: Payer: Self-pay

## 2021-11-29 ENCOUNTER — Telehealth: Payer: Self-pay | Admitting: Cardiology

## 2021-11-29 MED ORDER — HYDROCHLOROTHIAZIDE 12.5 MG PO CAPS: 12.5000 mg | ORAL_CAPSULE | Freq: Every day | ORAL | 1 refills | Status: DC

## 2021-11-29 NOTE — Telephone Encounter (Signed)
Patient says pharmacy needs approval for hydrochlorothiazide. Patient is out currently, please approve and refill ASAP. ?

## 2021-11-29 NOTE — Telephone Encounter (Signed)
Done

## 2021-12-05 ENCOUNTER — Other Ambulatory Visit: Payer: Self-pay

## 2021-12-05 MED ORDER — HYDROCHLOROTHIAZIDE 12.5 MG PO CAPS: 12.5000 mg | ORAL_CAPSULE | Freq: Every day | ORAL | 1 refills | Status: DC

## 2022-01-30 ENCOUNTER — Other Ambulatory Visit: Payer: Self-pay

## 2022-01-30 ENCOUNTER — Telehealth: Payer: Self-pay | Admitting: Cardiology

## 2022-01-30 ENCOUNTER — Encounter: Payer: Self-pay | Admitting: Cardiology

## 2022-01-30 DIAGNOSIS — I10 Essential (primary) hypertension: Secondary | ICD-10-CM

## 2022-01-30 MED ORDER — AMLODIPINE BESYLATE 10 MG PO TABS: 10.0000 mg | ORAL_TABLET | Freq: Every day | ORAL | 1 refills | Status: DC

## 2022-01-30 MED ORDER — LOSARTAN POTASSIUM-HCTZ 100-12.5 MG PO TABS: 1.0000 | ORAL_TABLET | ORAL | 3 refills | Status: DC

## 2022-01-30 MED ORDER — AMLODIPINE BESYLATE 10 MG PO TABS: 10.0000 mg | ORAL_TABLET | Freq: Every day | ORAL | 3 refills | Status: DC

## 2022-01-30 NOTE — Telephone Encounter (Signed)
ICD-10-CM   1. Essential hypertension  I10 amLODipine (NORVASC) 10 MG tablet    losartan-hydrochlorothiazide (HYZAAR) 100-12.5 MG tablet     Meds ordered this encounter  Medications   amLODipine (NORVASC) 10 MG tablet    Sig: Take 1 tablet (10 mg total) by mouth daily.    Dispense:  90 tablet    Refill:  3   losartan-hydrochlorothiazide (HYZAAR) 100-12.5 MG tablet    Sig: Take 1 tablet by mouth every morning.    Dispense:  90 tablet    Refill:  3    Medications Discontinued During This Encounter  Medication Reason   hydrochlorothiazide (MICROZIDE) 12.5 MG capsule Change in therapy   losartan (COZAAR) 100 MG tablet Change in therapy   amLODipine (NORVASC) 10 MG tablet

## 2022-02-27 ENCOUNTER — Telehealth: Payer: Self-pay

## 2022-02-27 ENCOUNTER — Other Ambulatory Visit: Payer: Self-pay

## 2022-02-27 DIAGNOSIS — I10 Essential (primary) hypertension: Secondary | ICD-10-CM

## 2022-02-27 MED ORDER — LOSARTAN POTASSIUM-HCTZ 100-12.5 MG PO TABS: 1.0000 | ORAL_TABLET | ORAL | 3 refills | Status: DC

## 2022-02-27 NOTE — Telephone Encounter (Signed)
Patient called and left a VM requesting a refill on his Losartan- HCTZ to be sent to his pharmacy. Done

## 2022-04-23 ENCOUNTER — Ambulatory Visit: Payer: 59 | Admitting: Student

## 2022-06-25 ENCOUNTER — Other Ambulatory Visit: Payer: Self-pay

## 2022-06-25 ENCOUNTER — Other Ambulatory Visit: Payer: Self-pay | Admitting: Cardiology

## 2022-06-25 DIAGNOSIS — I10 Essential (primary) hypertension: Secondary | ICD-10-CM

## 2022-06-25 MED ORDER — LOSARTAN POTASSIUM-HCTZ 100-12.5 MG PO TABS: 1.0000 | ORAL_TABLET | ORAL | 3 refills | Status: DC

## 2022-07-03 ENCOUNTER — Other Ambulatory Visit: Payer: Self-pay | Admitting: Cardiology

## 2022-07-03 DIAGNOSIS — I25119 Atherosclerotic heart disease of native coronary artery with unspecified angina pectoris: Secondary | ICD-10-CM

## 2022-07-22 ENCOUNTER — Ambulatory Visit: Payer: 59 | Admitting: Cardiology

## 2022-09-08 ENCOUNTER — Other Ambulatory Visit: Payer: Self-pay

## 2022-09-08 DIAGNOSIS — E782 Mixed hyperlipidemia: Secondary | ICD-10-CM

## 2022-09-08 DIAGNOSIS — I25119 Atherosclerotic heart disease of native coronary artery with unspecified angina pectoris: Secondary | ICD-10-CM

## 2022-09-08 MED ORDER — EZETIMIBE 10 MG PO TABS: 10.0000 mg | ORAL_TABLET | Freq: Every day | ORAL | 0 refills | Status: DC

## 2022-09-08 MED ORDER — ZYPITAMAG 4 MG PO TABS: 1.0000 | ORAL_TABLET | Freq: Every day | ORAL | 0 refills | Status: DC

## 2022-09-10 ENCOUNTER — Ambulatory Visit: Payer: 59 | Admitting: Cardiology

## 2022-09-10 ENCOUNTER — Encounter: Payer: Self-pay | Admitting: Cardiology

## 2022-09-10 VITALS — BP 135/79 | HR 97 | Resp 14 | Ht 68.0 in | Wt 223.0 lb

## 2022-09-10 DIAGNOSIS — I25119 Atherosclerotic heart disease of native coronary artery with unspecified angina pectoris: Secondary | ICD-10-CM

## 2022-09-10 DIAGNOSIS — E1042 Type 1 diabetes mellitus with diabetic polyneuropathy: Secondary | ICD-10-CM

## 2022-09-10 DIAGNOSIS — E7800 Pure hypercholesterolemia, unspecified: Secondary | ICD-10-CM

## 2022-09-10 DIAGNOSIS — I10 Essential (primary) hypertension: Secondary | ICD-10-CM

## 2022-09-10 MED ORDER — NITROGLYCERIN 0.4 MG SL SUBL: 0.4000 mg | SUBLINGUAL_TABLET | SUBLINGUAL | 3 refills | Status: DC | PRN

## 2022-09-10 MED ORDER — EZETIMIBE 10 MG PO TABS: 10.0000 mg | ORAL_TABLET | Freq: Every day | ORAL | 3 refills | Status: DC

## 2022-09-10 MED ORDER — AMLODIPINE BESYLATE 10 MG PO TABS: 10.0000 mg | ORAL_TABLET | Freq: Every day | ORAL | 3 refills | Status: DC

## 2022-09-10 MED ORDER — LOSARTAN POTASSIUM-HCTZ 100-12.5 MG PO TABS: 1.0000 | ORAL_TABLET | ORAL | 3 refills | Status: DC

## 2022-09-10 MED ORDER — ROSUVASTATIN CALCIUM 10 MG PO TABS: 10.0000 mg | ORAL_TABLET | Freq: Every day | ORAL | 2 refills | Status: DC

## 2022-09-10 NOTE — Progress Notes (Signed)
Primary Physician/Referring:  Viann Fish, NP  Patient ID: Peter Blake, male    DOB: [birth year], 57 y.o.   MRN: [id number]  Chief Complaint  Patient presents with   Coronary Artery Disease   Hypertension   Hyperlipidemia   Follow-up    1 year   HPI:    Peter Blake  is a 57 y.o. 57 y/o Caucasian male with CAD s/p PCI to anomaloous LCx and mid LAD (09/2017), hypertension, hyperlipidemia, statin intolerance, uncontrolled type 1 DM, erectile dysfunction.  He is presently tolerating Livalo and Zetia. He developed diabetic retinopathy and also has peripheral neuropathy.  Patient presents for annual follow-up. Patient's primary concern today is peripheral neuropathy which he states is bothersome but has been relatively stable since last office visit.  Denies chest pain, palpitations, syncope, near syncope.  Denies dyspnea, dizziness, orthopnea, PND, leg swelling.  Past Medical History:  Diagnosis Date   Coronary artery disease    Diabetes mellitus without complication ([place]) 123XX123   Type I    GERD (gastroesophageal reflux disease)    Hyperlipidemia    Hypertension    Peripheral vascular disease ([place])    Primary hypertension 10/11/2007   Qualifier: Diagnosis of   By: Loanne Drilling MD, Jacelyn Pi      Past Surgical History:  Procedure Laterality Date   CHOLECYSTECTOMY N/A 03/16/2014   Procedure: LAPAROSCOPIC CHOLECYSTECTOMY WITH INTRAOPERATIVE CHOLANGIOGRAM;  Surgeon: [name], MD;  Location: [facility];  Service: General;  Laterality: N/A;   CORONARY STENT INTERVENTION N/A 09/29/2017   Procedure: CORONARY STENT INTERVENTION;  Surgeon: Nigel Mormon, MD;  Location: [facility];  Service: Cardiovascular;  Laterality: N/A;   CORONARY STENT PLACEMENT  2019   2 stents   LEFT HEART CATH AND CORONARY ANGIOGRAPHY N/A 09/22/2017   Procedure: LEFT HEART CATH AND CORONARY ANGIOGRAPHY;  Surgeon: Nigel Mormon, MD;  Location: [facility];  Service:  Cardiovascular;  Laterality: N/A;   LOWER EXTREMITY ANGIOGRAPHY N/A 09/22/2017   Procedure: LOWER EXTREMITY ANGIOGRAPHY;  Surgeon: Nigel Mormon, MD;  Location: [facility];  Service: Cardiovascular;  Laterality: N/A;   Family History  Adopted: Yes  Problem Relation Age of Onset   Cancer Mother    Heart attack Mother    Lung cancer Father    Colon cancer Neg Hx    Social History   Tobacco Use   Smoking status: Never   Smokeless tobacco: Never  Substance Use Topics   Alcohol use: No   Marital Status: Married   ROS  Review of Systems  Cardiovascular:  Negative for chest pain, dyspnea on exertion and leg swelling.   Objective      09/10/2022    9:04 AM 07/08/2021    8:23 AM 04/23/2021    1:59 PM  Vitals with BMI  Height 5' 8"$   5' 8"$   Weight 223 lbs  214 lbs 13 oz  BMI A999333  [id number]  Systolic A999333 [id number]  Diastolic 79 78 84  Pulse 97 102 88    Blood pressure 135/79, pulse 97, resp. rate 14, height 5' 8"$  (1.727 m), weight 223 lb (101.2 kg), SpO2 96 %. Body mass index is 33.91 kg/m.   Physical Exam Vitals reviewed.  Neck:     Vascular: No carotid bruit or JVD.  Cardiovascular:     Rate and Rhythm: Normal rate and regular rhythm.     Pulses: Normal pulses and intact distal pulses.  Heart sounds: Normal heart sounds, S1 normal and S2 normal. No murmur heard.    No gallop.     Comments: No JVD.  Pulmonary:     Effort: Pulmonary effort is normal.     Breath sounds: Normal breath sounds.  Abdominal:     General: Bowel sounds are normal.     Palpations: Abdomen is soft.     Hernia: A hernia is present. Hernia is present in the ventral area.  Musculoskeletal:     Right lower leg: Edema (trace) present.     Left lower leg: Edema (trace) present.  Skin:    Capillary Refill: Capillary refill takes less than 2 seconds.    Laboratory examination:   External Labs:  Cholesterol, total 213.000 m 07/29/2022 HDL 37.000 mg 07/29/2022 LDL 153.000 m  07/29/2022 Triglycerides 124.000 m 07/29/2022  Labs 07/30/2022:  Serum glucose: 20 mg, BUN 18, creatinine 1.03, EGFR 85 mill, potassium 4.0, LFTs normal.  A1C 8.0% 12/23/2021  Labs 04/26/2019: Serum glucose 176 mg, potassium 4.9, BUN 19, creatinine 1.19, eGFR 69/80 ML.   Total cholesterol 139, triglycerides 137, HDL 28, LDL 86.  Allergies   Allergies  Allergen Reactions   Coreg [Carvedilol] Other (See Comments)    Fatigue   Augmentin [Amoxicillin-Pot Clavulanate] Nausea And Vomiting    Did it involve swelling of the face/tongue/throat, SOB, or low BP? no Did it involve sudden or severe rash/hives, skin peeling, or any reaction on the inside of your mouth or nose? no Did you need to seek medical attention at a hospital or doctor's office? no When did it last happen?      2015 If all above answers are "NO", may proceed with cephalosporin use.    Metformin And Related Diarrhea   Metoprolol Other (See Comments)    Diarrhoea   Invokana [Canagliflozin] Rash    Final Medications at End of Visit     Current Outpatient Medications:    aspirin EC 81 MG tablet, Take 81 mg by mouth daily., Disp: , Rfl:    Continuous Blood Gluc Sensor (FREESTYLE LIBRE 2 SENSOR SYSTM) MISC, USE AS DIRECTED CHANGING EVERY 14 DAY, Disp: , Rfl:    Insulin Glargine (TOUJEO SOLOSTAR [place]), Inject into the skin. 15 units at bedtime, Disp: , Rfl:    insulin lispro (HUMALOG) 100 UNIT/ML injection, Inject 20-30 Units into the skin 3 (three) times daily before meals. Per sliding scale, Disp: , Rfl:    rosuvastatin (CRESTOR) 10 MG tablet, Take 1 tablet (10 mg total) by mouth daily., Disp: 30 tablet, Rfl: 2   amLODipine (NORVASC) 10 MG tablet, Take 1 tablet (10 mg total) by mouth daily., Disp: 90 tablet, Rfl: 3   ezetimibe (ZETIA) 10 MG tablet, Take 1 tablet (10 mg total) by mouth daily. Patient needs to complete an appointment for additional refills., Disp: 90 tablet, Rfl: 3   losartan-hydrochlorothiazide (HYZAAR) 100-12.5 MG  tablet, Take 1 tablet by mouth every morning., Disp: 90 tablet, Rfl: 3   nitroGLYCERIN (NITROSTAT) 0.4 MG SL tablet, Place 1 tablet (0.4 mg total) under the tongue every 5 (five) minutes as needed for chest pain., Disp: 25 tablet, Rfl: 3   Radiology:  No results found.  Cardiac Studies:   Echocardiogram 09/02/2017: Left ventricle cavity is normal in size. Moderate concentric hypertrophy of the left ventricle. Normal global wall motion. Doppler evidence of grade I (impaired) diastolic dysfunction, normal LAP. Calculated EF 55%. Mild to moderate mitral regurgitation. Inadequate tricuspid regurgitation jet to estimate pulmonary artery pressure.  Normal right atrial pressure.   Abdominal angiogram 09/22/2017:  Abdominal aorta distal 30%. No significant PAD. Slow flow. No Iliac artery disease.   Coronary angiogram 09/29/2017:  Severe multivessel CAD: LAD: Mid LAD 80% stenosis, 3.5 X 18 mm Xience DES to mid LAD w 0% residual stenosis. Anomalous LCx: Prox 80% stenosis, S/P  2.5 X 23 mm Xience DES to anamolous LCx RCA on angiogram 09/22/16, PL branch 100% occlusion, diffuse disease with grade 1 collaterals from LAD septal perforators.      EKG   EKG 09/10/2022: Normal sinus rhythm heart rate of 82 bpm, normal axis, right bundle branch block.  No evidence of ischemia.  Compared to 04/23/2021, no significant change.  Assessment     ICD-10-CM   1. Coronary artery disease involving native coronary artery of native heart with angina pectoris (HCC)  I25.119 EKG 12-Lead    rosuvastatin (CRESTOR) 10 MG tablet    LDL cholesterol, direct    Lipid Panel With LDL/HDL Ratio    Lipoprotein A (LPA)    CBC    nitroGLYCERIN (NITROSTAT) 0.4 MG SL tablet    2. Pure hypercholesterolemia  E78.00 rosuvastatin (CRESTOR) 10 MG tablet    ezetimibe (ZETIA) 10 MG tablet    LDL cholesterol, direct    Lipid Panel With LDL/HDL Ratio    Lipoprotein A (LPA)    3. Primary hypertension  I10 amLODipine (NORVASC) 10 MG  tablet    losartan-hydrochlorothiazide (HYZAAR) 100-12.5 MG tablet    4. Type 1 diabetes mellitus with diabetic polyneuropathy (HCC)  E10.42       Meds ordered this encounter  Medications   rosuvastatin (CRESTOR) 10 MG tablet    Sig: Take 1 tablet (10 mg total) by mouth daily.    Dispense:  30 tablet    Refill:  2   ezetimibe (ZETIA) 10 MG tablet    Sig: Take 1 tablet (10 mg total) by mouth daily. Patient needs to complete an appointment for additional refills.    Dispense:  90 tablet    Refill:  3    DX Code Needed  .   amLODipine (NORVASC) 10 MG tablet    Sig: Take 1 tablet (10 mg total) by mouth daily.    Dispense:  90 tablet    Refill:  3   losartan-hydrochlorothiazide (HYZAAR) 100-12.5 MG tablet    Sig: Take 1 tablet by mouth every morning.    Dispense:  90 tablet    Refill:  3   nitroGLYCERIN (NITROSTAT) 0.4 MG SL tablet    Sig: Place 1 tablet (0.4 mg total) under the tongue every 5 (five) minutes as needed for chest pain.    Dispense:  25 tablet    Refill:  3   Medications Discontinued During This Encounter  Medication Reason   Pitavastatin Magnesium (ZYPITAMAG) 4 MG TABS Not covered by the pt's insurance   oseltamivir (TAMIFLU) 75 MG capsule Completed Course   polyvinyl alcohol (LIQUIFILM TEARS) 1.4 % ophthalmic solution    tobramycin (TOBREX) 0.3 % ophthalmic solution Completed Course   ezetimibe (ZETIA) 10 MG tablet Reorder   Pitavastatin Magnesium (ZYPITAMAG) 4 MG TABS Not covered by the pt's insurance   tobramycin (TOBREX) 0.3 % ophthalmic solution Completed Course   nitroGLYCERIN (NITROSTAT) 0.4 MG SL tablet Reorder   amLODipine (NORVASC) 10 MG tablet Reorder   losartan-hydrochlorothiazide (HYZAAR) 100-12.5 MG tablet Reorder    Recommendations:   Peter Blake  is a 57 y.o. Caucasian male with CAD s/p PCI to  anomaloous LCx and mid LAD (09/2017), hypertension, hyperlipidemia, statin intolerance, uncontrolled type 1 DM, erectile dysfunction.  He is  presently tolerating Livalo and Zetia. He now has developed worsening diabetic retinopathy and also has peripheral neuropathy. Patient presents for annual follow-up.     1. Coronary artery disease involving native coronary artery of native heart with angina pectoris [facility]) Patient without significant angina pectoris, I refilled his prescription for nitroglycerin.  He is active at workplace and climbing 2 flights of stairs at least 20-30 times a day and states that he has not had any shortness of breath or chest discomfort.  Will continue monitoring, could consider stress test as it has been >5 years since angioplasty.  2. Mixed hyperlipidemia He has discontinued taking Livalo as insurance did not cover this.  He is now willing to try Crestor 10 mg, previously could not tolerate any statins.  Also refilled his prescription for Zetia.  Will check lipids in 6 weeks and see him back at that time.  Will consider initiation of PCSK9 inhibitors.  3. Primary hypertension Blood pressure is well-controlled.  4. Type 1 diabetes mellitus with diabetic polyneuropathy ([place]) Plan the patient's diabetes is now well-controlled, he is on libre 3 sensor and last 90 days his A1c has been around 6.6-6.8 this is a remarkable improvement compared to >8 HbA1c   Adrian Prows, MD, [facility] 09/10/2022, 9:37 AM Office: [phone] Fax: [phone] Pager: [phone]

## 2022-10-27 ENCOUNTER — Ambulatory Visit: Payer: 59 | Admitting: Cardiology

## 2022-12-02 ENCOUNTER — Encounter: Payer: Self-pay | Admitting: Cardiology

## 2022-12-03 NOTE — Telephone Encounter (Signed)
From patient.

## 2023-03-24 ENCOUNTER — Other Ambulatory Visit: Payer: Self-pay | Admitting: Cardiology

## 2023-03-24 DIAGNOSIS — I10 Essential (primary) hypertension: Secondary | ICD-10-CM

## 2023-07-07 ENCOUNTER — Other Ambulatory Visit: Payer: Self-pay | Admitting: Cardiology

## 2023-07-07 DIAGNOSIS — I10 Essential (primary) hypertension: Secondary | ICD-10-CM

## 2023-09-12 ENCOUNTER — Other Ambulatory Visit: Payer: Self-pay | Admitting: Cardiology

## 2023-09-12 DIAGNOSIS — I25119 Atherosclerotic heart disease of native coronary artery with unspecified angina pectoris: Secondary | ICD-10-CM

## 2023-09-14 MED ORDER — NITROGLYCERIN 0.4 MG SL SUBL: 0.4000 mg | SUBLINGUAL_TABLET | SUBLINGUAL | 0 refills | Status: DC | PRN

## 2023-09-14 NOTE — Addendum Note (Signed)
 Addended by: Margaret Pyle D on: 09/14/2023 07:39 AM   Modules accepted: Orders

## 2023-10-14 ENCOUNTER — Other Ambulatory Visit: Payer: Self-pay

## 2023-10-14 DIAGNOSIS — I25119 Atherosclerotic heart disease of native coronary artery with unspecified angina pectoris: Secondary | ICD-10-CM

## 2023-10-14 MED ORDER — NITROGLYCERIN 0.4 MG SL SUBL: 0.4000 mg | SUBLINGUAL_TABLET | SUBLINGUAL | 0 refills | Status: DC | PRN

## 2023-10-30 ENCOUNTER — Other Ambulatory Visit: Payer: Self-pay | Admitting: Cardiology

## 2023-10-30 DIAGNOSIS — I10 Essential (primary) hypertension: Secondary | ICD-10-CM

## 2023-11-19 ENCOUNTER — Other Ambulatory Visit: Payer: Self-pay | Admitting: Cardiology

## 2023-11-19 DIAGNOSIS — E7800 Pure hypercholesterolemia, unspecified: Secondary | ICD-10-CM

## 2023-11-25 ENCOUNTER — Telehealth: Payer: Self-pay | Admitting: Cardiology

## 2023-11-25 DIAGNOSIS — I10 Essential (primary) hypertension: Secondary | ICD-10-CM

## 2023-11-25 DIAGNOSIS — E7800 Pure hypercholesterolemia, unspecified: Secondary | ICD-10-CM

## 2023-11-25 MED ORDER — EZETIMIBE 10 MG PO TABS: 10.0000 mg | ORAL_TABLET | Freq: Every day | ORAL | 1 refills | Status: DC

## 2023-11-25 MED ORDER — AMLODIPINE BESYLATE 10 MG PO TABS: 10.0000 mg | ORAL_TABLET | Freq: Every day | ORAL | 1 refills | Status: DC

## 2023-11-25 MED ORDER — LOSARTAN POTASSIUM-HCTZ 100-12.5 MG PO TABS: 1.0000 | ORAL_TABLET | ORAL | 1 refills | Status: DC

## 2023-11-25 NOTE — Telephone Encounter (Signed)
 Pt's medications were sent to pt's pharmacies as requested. Confirmation from both

## 2023-11-25 NOTE — Telephone Encounter (Signed)
*  STAT* If patient is at the pharmacy, call can be transferred to refill team.   1. Which medications need to be refilled? (please list name of each medication and dose if known)   ezetimibe  (ZETIA ) 10 MG tablet   2. Would you like to learn more about the convenience, safety, & potential cost savings by using the [facility]?   3. Are you open to using the [facility] (Type [facility]. ).  4. Which pharmacy/location (including street and city if local pharmacy) is medication to be sent to?  [facility] - [place], [place] - [address]   5. Do they need a 30 day or 90 day supply?    *STAT* If patient is at the pharmacy, call can be transferred to refill team.   1. Which medications need to be refilled? (please list name of each medication and dose if known)   losartan -hydrochlorothiazide  (HYZAAR) 100-12.5 MG tablet  amLODipine  (NORVASC ) 10 MG tablet   2. Would you like to learn more about the convenience, safety, & potential cost savings by using the [facility]?   3. Are you open to using the [facility] (Type [facility]. ).  4. Which pharmacy/location (including street and city if local pharmacy) is medication to be sent to?  [facility] - [place], [place] - [address]   5. Do they need a 30 day or 90 day supply?    Patient stated he has some medication left.  Patient has appointment scheduled with Dr. Berry Bristol on 6/27.

## 2023-11-27 ENCOUNTER — Encounter: Payer: Self-pay | Admitting: Cardiology

## 2023-11-27 DIAGNOSIS — I25119 Atherosclerotic heart disease of native coronary artery with unspecified angina pectoris: Secondary | ICD-10-CM

## 2023-11-27 DIAGNOSIS — E7800 Pure hypercholesterolemia, unspecified: Secondary | ICD-10-CM

## 2023-11-27 MED ORDER — ROSUVASTATIN CALCIUM 10 MG PO TABS: 10.0000 mg | ORAL_TABLET | Freq: Every day | ORAL | 1 refills | Status: DC

## 2024-01-06 ENCOUNTER — Ambulatory Visit: Payer: Self-pay | Attending: Cardiology | Admitting: Cardiology

## 2024-01-06 ENCOUNTER — Encounter: Payer: Self-pay | Admitting: Cardiology

## 2024-01-06 VITALS — BP 132/76 | HR 74 | Resp 16 | Ht 68.0 in | Wt 222.0 lb

## 2024-01-06 DIAGNOSIS — E7800 Pure hypercholesterolemia, unspecified: Secondary | ICD-10-CM

## 2024-01-06 DIAGNOSIS — I25119 Atherosclerotic heart disease of native coronary artery with unspecified angina pectoris: Secondary | ICD-10-CM | POA: Diagnosis not present

## 2024-01-06 DIAGNOSIS — G720 Drug-induced myopathy: Secondary | ICD-10-CM | POA: Diagnosis not present

## 2024-01-06 DIAGNOSIS — T466X5D Adverse effect of antihyperlipidemic and antiarteriosclerotic drugs, subsequent encounter: Secondary | ICD-10-CM

## 2024-01-06 DIAGNOSIS — T466X5A Adverse effect of antihyperlipidemic and antiarteriosclerotic drugs, initial encounter: Secondary | ICD-10-CM

## 2024-01-06 DIAGNOSIS — I10 Essential (primary) hypertension: Secondary | ICD-10-CM

## 2024-01-06 MED ORDER — EZETIMIBE 10 MG PO TABS: 10.0000 mg | ORAL_TABLET | Freq: Every day | ORAL | 2 refills | Status: AC

## 2024-01-06 MED ORDER — ROSUVASTATIN CALCIUM 10 MG PO TABS
10.0000 mg | ORAL_TABLET | Freq: Every day | ORAL | 2 refills | Status: AC
Start: 2024-01-06 — End: 2024-04-05

## 2024-01-06 NOTE — Progress Notes (Unsigned)
 Cardiology Office Note:  .   Date:  01/07/2024  ID:  Peter Blake, DOB 29-Apr-1966, MRN [id number] PCP: Alma Jackson, NP  [facility] Providers Cardiologist:  Knox Perl, MD   History of Present Illness: .   Peter Blake is a 58 y.o. Caucasian male with CAD s/p PCI to anomaloous LCx and mid LAD (09/2017), hypertension, hyperlipidemia, statin intolerance, uncontrolled type 1 DM with diabetic retinopathy and peripheral neuropathy, erectile dysfunction. He is presently tolerating Livalo  and Zetia .  He has normal LVEF by echocardiogram in 2019.  Discussed the use of AI scribe software for clinical note transcription with the patient, who gave verbal consent to proceed.  History of Present Illness Peter Blake is a 58 year old male with type 1 diabetes and coronary artery disease who presents for CAD, medication management follow-up.  He has coronary artery disease and faces financial difficulties affecting his ability to afford medications. He was previously on Livalo  which he tolerated well and has had statin intolerance to other agents.  Presently was prescribed 10 mg of Crestor  and Zetia  for cholesterol management but has not taken them for over a year.  Livalo  which was initially covered by insurance but is no longer available. He is currently taking losartan , amlodipine , and aspirin .  He has type 1 diabetes with recent improvement in glycemic control, A1c fstill high at 8.2%. He is currently on insulin  therapy and experiences neuropathy in his feet. No shortness of breath, chest pain, leg cramping, or leg swelling. 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 He and his wife have recently started walking about a mile a day for exercise. Financial struggles are a significant barrier to accessing healthcare and medications.  Labs   External Labs:  Care everywhere PCP labs 11/13/2023:  Serum glucose 188 mg, BUN 17, creatinine  1.15, EGFR 74 mL, potassium 4.3, LFTs normal.  Total cholesterol 252, triglycerides 200, HDL 32, LDL 152.  Urinary albumin to creatinine ratio 20.  A1c 8.1%.  TSH normal at 4.22.  ROS  Review of Systems  Cardiovascular:  Negative for chest pain, dyspnea on exertion and leg swelling.    Physical Exam:   VS:  BP 132/76 (BP Location: Left Arm, Patient Position: Sitting, Cuff Size: Normal)   Pulse 74   Resp 16   Ht 5' 8 (1.727 m)   Wt 222 lb (100.7 kg)   SpO2 93%   BMI 33.75 kg/m    Wt Readings from Last 3 Encounters:  01/06/24 222 lb (100.7 kg)  09/10/22 223 lb (101.2 kg)  04/23/21 214 lb 12.8 oz (97.4 kg)    Physical Exam Neck:     Vascular: No carotid bruit or JVD.   Cardiovascular:     Rate and Rhythm: Normal rate and regular rhythm.     Pulses: Intact distal pulses.     Heart sounds: Normal heart sounds. No murmur heard.    No gallop.  Pulmonary:     Effort: Pulmonary effort is normal.     Breath sounds: Normal breath sounds.  Abdominal:     General: Bowel sounds are normal.     Palpations: Abdomen is soft.     Hernia: A hernia is present. Hernia is present in the ventral area.   Musculoskeletal:     Right lower leg: No edema.     Left lower leg: No edema.    Studies Reviewed: Aaron Aas    Coronary angiogram 09/29/2017:  Mid LAD 80% stenosis, 3.5 X 18 mm Xience DES  Prox LCx:  2.5 X 23 mm Xience DES   EKG:    EKG Interpretation Date/Time:  Wednesday January 06 2024 15:34:54 EDT Ventricular Rate:  77 PR Interval:  172 QRS Duration:  156 QT Interval:  410 QTC Calculation: 463 R Axis:   51  Text Interpretation: EKG 01/06/2024: Normal sinus rhythm at rate of 77 bpm, normal axis, right bundle branch block.  No significant change from 09/10/2022. Confirmed by Aftin Lye, Jagadeesh [phone]) on 01/06/2024 3:58:18 PM  EKG 09/10/2022: Normal sinus rhythm heart rate of 82 bpm, normal axis, right bundle branch block. No evidence of ischemia.   Medications ordered    Meds  ordered this encounter  Medications   rosuvastatin  (CRESTOR ) 10 MG tablet    Sig: Take 1 tablet (10 mg total) by mouth daily.    Dispense:  90 tablet    Refill:  2   ezetimibe  (ZETIA ) 10 MG tablet    Sig: Take 1 tablet (10 mg total) by mouth daily.    Dispense:  90 tablet    Refill:  2    Pt must keep upcoming appt in June 2025 with Dr. Berry Bristol before anymore refills. Thank you Final Attempt     ASSESSMENT AND PLAN: .      ICD-10-CM   1. Coronary artery disease involving native coronary artery of native heart with angina pectoris (HCC)  I25.119 EKG 12-Lead    2. Pure hypercholesterolemia  E78.00 rosuvastatin  (CRESTOR ) 10 MG tablet    ezetimibe  (ZETIA ) 10 MG tablet    AMB Referral to [facility]    3. Primary hypertension  I10     4. Statin myopathy  G72.0 AMB Referral to [facility] Pharm[facility]   [id number]      Assessment & Plan Coronary Artery Disease Coronary artery disease with potential complications such as myocardial infarction or stroke. Emphasis on medication adherence and lifestyle changes to mitigate risks. Discussed potential benefit of Ozempic for CAD indication, despite insurance challenges due to type 1 diabetes status. - Refer to pharmacy to explore options for Ozempic for CAD indication. - Encourage adherence to prescribed medications and lifestyle modifications to reduce cardiovascular risk.  Primary hypertension Blake pressure is well-controlled with amlodipine  10 mg daily and losartan  HCT 100/12.5 mg in the morning.  Renal function has remained stable and normal.  Type 1 Diabetes Mellitus Type 1 diabetes with recent ? improvement in glycemic control, A1c reduced from 8.2% to 6.9% per patient. No reported eye involvement, but overdue for an annual eye exam. Financial constraints impacting healthcare access. Discussed potential use of Ozempic for weight management, despite being type 1 diabetic, to aid in reducing insulin  requirements and improving glycemic  control. - Refer to pharmacy to explore options for Ozempic for weight management and potential CAD indication. - Encourage regular eye exams to monitor for diabetic retinopathy. - Advise on lifestyle modifications, including diet and exercise, to improve glycemic control.  Peripheral Neuropathy Peripheral neuropathy in the feet, likely secondary to diabetes. No new symptoms reported.  Hyperlipidemia Hyperlipidemia with previous difficulty in medication adherence due to financial constraints. Plan to initiate Repatha for cholesterol management due to its minimal side effects and potential for significant LDL reduction. Discussed the use of rosuvastatin  and Zetia  as cost-effective options, with coordination for reduced cost through coupon cards and insurance. - Prescribe Repatha for cholesterol management, pending insurance approval. - Prescribe rosuvastatin  to be taken twice a week, with a 90-day supply  and two refills.  Patient has not been able to tolerate statins in the past, only statin he tolerated previously was Livalo  - Prescribe Zetia , one pill once a day, with a 90-day supply and two refills. - Refer to pharmacy to assist with obtaining medications at reduced cost through coupon cards and insurance coordination.  I will see him back in 6 months for follow-up of CAD and hypercholesterolemia.  Signed,  Knox Perl, MD, [facility] 01/07/2024, 3:16 AM [facility] [address] [place], [place] [address] Phone: [phone]. Fax:  [phone]

## 2024-01-06 NOTE — Patient Instructions (Addendum)
 Medication Instructions:  Your physician has recommended you make the following change in your medication:  Start Rosuvastatin  10 mg by mouth daily  Start Zetia  10 mg by mouth daily   *If you need a refill on your cardiac medications before your next appointment, please call your pharmacy*  Lab Work: none If you have labs (blood work) drawn today and your tests are completely normal, you will receive your results only by: MyChart Message (if you have MyChart) OR A paper copy in the mail If you have any lab test that is abnormal or we need to change your treatment, we will call you to review the results.  Testing/Procedures: none  Follow-Up: At [facility], you and your health needs are our priority.  As part of our continuing mission to provide you with exceptional heart care, our providers are all part of one team.  This team includes your primary Cardiologist (physician) and Advanced Practice Providers or APPs (Physician Assistants and Nurse Practitioners) who all work together to provide you with the care you need, when you need it.  Your next appointment:   6 month(s)  Provider:   Knox Perl, MD    We recommend signing up for the patient portal called MyChart.  Sign up information is provided on this After Visit Summary.  MyChart is used to connect with patients for Virtual Visits (Telemedicine).  Patients are able to view lab/test results, encounter notes, upcoming appointments, etc.  Non-urgent messages can be sent to your provider as well.   To learn more about what you can do with MyChart, go to [email or web address].   Other Instructions     You have been referred to see the pharmacist in our office.  Please schedule new patient appointment

## 2024-01-15 ENCOUNTER — Ambulatory Visit: Admitting: Cardiology

## 2024-02-18 ENCOUNTER — Ambulatory Visit: Admitting: Pharmacist

## 2024-02-29 ENCOUNTER — Other Ambulatory Visit: Payer: Self-pay

## 2024-02-29 ENCOUNTER — Telehealth: Payer: Self-pay | Admitting: Cardiology

## 2024-02-29 DIAGNOSIS — I25119 Atherosclerotic heart disease of native coronary artery with unspecified angina pectoris: Secondary | ICD-10-CM

## 2024-02-29 MED ORDER — NITROGLYCERIN 0.4 MG SL SUBL: 0.4000 mg | SUBLINGUAL_TABLET | SUBLINGUAL | 3 refills | Status: AC | PRN

## 2024-02-29 NOTE — Telephone Encounter (Signed)
*  STAT* If patient is at the pharmacy, call can be transferred to refill team.   1. Which medications need to be refilled? (please list name of each medication and dose if known)  nitroGLYCERIN  (NITROSTAT ) 0.4 MG SL tablet  2. Which pharmacy/location (including street and city if local pharmacy) is medication to be sent to? [facility] - [place], [place] - [address]  3. Do they need a 30 day or 90 day supply?  Standard supply - patient is completely out of medication. He says he lost his bottle over the weekend.

## 2024-03-03 ENCOUNTER — Other Ambulatory Visit: Payer: Self-pay

## 2024-03-03 DIAGNOSIS — I10 Essential (primary) hypertension: Secondary | ICD-10-CM

## 2024-03-03 MED ORDER — LOSARTAN POTASSIUM-HCTZ 100-12.5 MG PO TABS: 1.0000 | ORAL_TABLET | ORAL | 3 refills | Status: AC

## 2024-04-05 ENCOUNTER — Other Ambulatory Visit: Payer: Self-pay | Admitting: Cardiology

## 2024-04-05 DIAGNOSIS — I10 Essential (primary) hypertension: Secondary | ICD-10-CM

## 2024-04-14 ENCOUNTER — Telehealth: Payer: Self-pay

## 2024-04-14 NOTE — Telephone Encounter (Signed)
   Pre-operative Risk Assessment    Patient Name: Peter Blake  DOB: [birth year] MRN: [id number]   Date of last office visit: 01/06/24 GORDY BERGAMO, MD Date of next office visit: NONE   Request for Surgical Clearance    Procedure:  RIGHT INDEX AND RING FINGER A1 PULLEY RELEASE  Date of Surgery:  Clearance TBD                                Surgeon:  DR BEBE GALLA Surgeon's Group or Practice Name:  [facility] Phone number:  [phone] Fax number:  [phone]  ATTN: JOEN WILLS   Type of Clearance Requested:   - Medical  - Pharmacy:  Hold Aspirin      Type of Anesthesia:  Not Indicated   Additional requests/questions:    SignedLucie DELENA Ku   04/14/2024, 12:55 PM

## 2024-04-14 NOTE — Telephone Encounter (Signed)
  Patient Consent for Virtual Visit         Peter Blake has provided verbal consent on 04/14/2024 for a virtual visit (video or telephone). Appointment scheduled on 05/09/2024  CONSENT FOR VIRTUAL VISIT FOR:  [name] Ny  By participating in this virtual visit I agree to the following:  I hereby voluntarily request, consent and authorize [facility] and its employed or contracted physicians, physician assistants, nurse practitioners or other licensed health care professionals (the Practitioner), to provide me with telemedicine health care services (the "Services) as deemed necessary by the treating Practitioner. I acknowledge and consent to receive the Services by the Practitioner via telemedicine. I understand that the telemedicine visit will involve communicating with the Practitioner through live audiovisual communication technology and the disclosure of certain medical information by electronic transmission. I acknowledge that I have been given the opportunity to request an in-person assessment or other available alternative prior to the telemedicine visit and am voluntarily participating in the telemedicine visit.  I understand that I have the right to withhold or withdraw my consent to the use of telemedicine in the course of my care at any time, without affecting my right to future care or treatment, and that the Practitioner or I may terminate the telemedicine visit at any time. I understand that I have the right to inspect all information obtained and/or recorded in the course of the telemedicine visit and may receive copies of available information for a reasonable fee.  I understand that some of the potential risks of receiving the Services via telemedicine include:  Delay or interruption in medical evaluation due to technological equipment failure or disruption; Information transmitted may not be sufficient (e.g. poor resolution of images) to allow for appropriate  medical decision making by the Practitioner; and/or  In rare instances, security protocols could fail, causing a breach of personal health information.  Furthermore, I acknowledge that it is my responsibility to provide information about my medical history, conditions and care that is complete and accurate to the best of my ability. I acknowledge that Practitioner's advice, recommendations, and/or decision may be based on factors not within their control, such as incomplete or inaccurate data provided by me or distortions of diagnostic images or specimens that may result from electronic transmissions. I understand that the practice of medicine is not an exact science and that Practitioner makes no warranties or guarantees regarding treatment outcomes. I acknowledge that a copy of this consent can be made available to me via my patient portal [facility] MyChart), or I can request a printed copy by calling the office of [facility].    I understand that my insurance will be billed for this visit.   I have read or had this consent read to me. I understand the contents of this consent, which adequately explains the benefits and risks of the Services being provided via telemedicine.  I have been provided ample opportunity to ask questions regarding this consent and the Services and have had my questions answered to my satisfaction. I give my informed consent for the services to be provided through the use of telemedicine in my medical care

## 2024-04-14 NOTE — Telephone Encounter (Signed)
   Name: Peter Blake  DOB: 07-18-1966  MRN: [id number]  Primary Cardiologist: Gordy Bergamo, MD   Preoperative team, please contact this patient and set up a phone call appointment for further preoperative risk assessment. Please obtain consent and complete medication review. Thank you for your help.  I confirm that guidance regarding antiplatelet and oral anticoagulation therapy has been completed and, if necessary, noted below.  Regarding ASA therapy, we recommend continuation of ASA throughout the perioperative period. However, if the surgeon feels that cessation of ASA is required in the perioperative period, it may be stopped 5-7 days prior to surgery with a plan to resume it as soon as felt to be feasible from a surgical standpoint in the post-operative period.    I also confirmed the patient resides in the state of [place] . As per [facility] Medical Board telemedicine laws, the patient must reside in the state in which the provider is licensed.   Josefa CHRISTELLA Beauvais, NP 04/14/2024, 1:42 PM [facility]

## 2024-04-14 NOTE — Telephone Encounter (Signed)
 Appointment scheduled for 05/09/2024 @ 9:40am. Med req and consent are complete.

## 2024-05-09 ENCOUNTER — Ambulatory Visit: Attending: Cardiovascular Disease

## 2024-05-09 DIAGNOSIS — Z01810 Encounter for preprocedural cardiovascular examination: Secondary | ICD-10-CM

## 2024-05-09 NOTE — Progress Notes (Signed)
   Patient Name: Peter Blake  DOB: 1965/09/23 MRN: [id number]  Primary Cardiologist: Gordy Bergamo, MD  Chart reviewed as part of pre-operative protocol coverage.  Patient was contacted today regarding preoperative cardiac evaluation via televisit for right index and ring finger A1 pulley release.  Patient reports that his procedure has been canceled and there are no plans to undergo procedure at this time.  Will no charge patient for visit today.  He will notify the office if his procedure is rescheduled.  Thank you, Vincie Linn D Phuc Kluttz, NP 05/09/2024, 9:45 AM
